# Patient Record
Sex: Male | Born: 1975 | Race: White | Hispanic: No | Marital: Married | Smoking: Never smoker
Health system: Southern US, Community
[De-identification: ages and names within clinical notes are randomized; demographics above are authoritative.]

## PROBLEM LIST (undated history)

## (undated) DIAGNOSIS — Z9989 Dependence on other enabling machines and devices: Secondary | ICD-10-CM

## (undated) DIAGNOSIS — R112 Nausea with vomiting, unspecified: Secondary | ICD-10-CM

## (undated) DIAGNOSIS — I10 Essential (primary) hypertension: Secondary | ICD-10-CM

## (undated) DIAGNOSIS — G4733 Obstructive sleep apnea (adult) (pediatric): Secondary | ICD-10-CM

## (undated) DIAGNOSIS — M109 Gout, unspecified: Secondary | ICD-10-CM

## (undated) DIAGNOSIS — R7303 Prediabetes: Secondary | ICD-10-CM

## (undated) DIAGNOSIS — Z9889 Other specified postprocedural states: Secondary | ICD-10-CM

## (undated) HISTORY — PX: SHOULDER ACROMIOPLASTY: SHX6093

## (undated) HISTORY — DX: Prediabetes: R73.03

## (undated) HISTORY — DX: Gout, unspecified: M10.9

## (undated) HISTORY — DX: Essential (primary) hypertension: I10

## (undated) HISTORY — DX: Obstructive sleep apnea (adult) (pediatric): G47.33

## (undated) HISTORY — DX: Dependence on other enabling machines and devices: Z99.89

---

## 2013-09-05 ENCOUNTER — Ambulatory Visit (INDEPENDENT_AMBULATORY_CARE_PROVIDER_SITE_OTHER): Payer: BC Managed Care – PPO | Admitting: General Surgery

## 2013-09-05 ENCOUNTER — Encounter (INDEPENDENT_AMBULATORY_CARE_PROVIDER_SITE_OTHER): Payer: Self-pay | Admitting: General Surgery

## 2013-09-05 VITALS — BP 126/80 | HR 76 | Ht 74.0 in | Wt >= 6400 oz

## 2013-09-05 DIAGNOSIS — Z6841 Body Mass Index (BMI) 40.0 and over, adult: Secondary | ICD-10-CM

## 2013-09-05 NOTE — Patient Instructions (Signed)
Congratulations on starting your journey to a healthier life! Over the next few weeks you will be undergoing tests (x-rays and labs) and seeing specialists to help evaluate you for weight loss surgery.  These tests and consultations with a psychologist and nutritionist are needed to prepare you for the lifestyle changes that lie ahead and are often required by insurance companies to approve you for surgery.   Pathway to Surgery:  Over the next few weeks -->Lab work -->Radiology tests   - Chest x-ray - make sure your lungs are normal before surgery  - Upper GI - you drink barium and pictures are taken as it travels down your  esophagus and into your stomach - looks for reflux and a hiatal hernia which may  need to repaired at the same time as your weight loss surgery  - Abdominal Ultrasound - looks at your gallbladder and liver  - Mammogram - up to date mammogram if you are a male -->EKG  -->Sleep study - if you are felt to be at high risk for obstructive sleep apnea -->H. Pylori breath test ([employer]ath[employer]ek) - you surgeon may order this test to see if you have  a bacteria (H pylori) in your stomach which makes you at higher risk to develop a  ulcer or inflammation of your stomach -->Nutrition consultation -->Psychologist consultation -->Other specialist consults - your surgeon may determine that you need to see a  specialist like a cardiologist or pulmnologist depending on your health history -->Watch EMMI video about your planned weight loss surgery -->you can look at [email or web address] to learn more about weight loss surgery and  compare surgery outcomes -->you can look at our new website - [email or web address] - available mid-August 2015  Two weeks prior to surgery  Go on the extremely low carb liquid diet - this will decrease the size of your liver  which will make surgery safer - the nutritionist will go over this at a later date  Attend preoperative appointment with your surgeon  Attend  preoperative surgery class  One week prior to surgery  No aspirin products.  Tylenol is acceptable   24 hours prior to surgery  No alcoholic beverages  Report fever greater than 100.5 or excessive nasal drainage suggesting infection  Continue bariatric preop diet  Perform bowel prep if ordered  Do not eat or drink anything after midnight the night before surgery  Do not take any medications except those instructed by the anesthesiologist  Morning of surgery  Please arrive at the hospital at least 2 hours before your scheduled surgery time.  No makeup, fingernail polish or jewelry  Bring insurance cards with you  Bring your CPAP mask if you use this

## 2013-09-05 NOTE — Progress Notes (Addendum)
Patient ID: Juan Winters, male   DOB: Dec 26, 1975, 37 y.o.   MRN: [id number]  Chief Complaint  Patient presents with  . new bariatric    HPI Juan Winters. Juan Winters is a 38 y.o. male.   HPI 38 year old morbidly obese Caucasian male referred by Dr. Paulino Rily for evaluation of weight loss surgery. The patient states he has struggled with his weight for many years. Despite numerous attempts for sustained weight loss he has been unsuccessful. He has done lots of yo-yo dieting. He has done Atkins, [place], [place] quick weight loss clinic - all without any long-term success. He is currently doing a physician supervised medical weight loss program.  He participated in our online seminar.  His comorbidities include hypertension, obstructive sleep apnea on CPAP, gout, right ankle pain, and prediabetes  He is most interested in the sleeve gastrectomy. His wife has had a Lap band and has had some issues. He also is unsure about the gastric bypass because of the potential nutritional problems associated with it but it is open to discussing it further Past Medical History  Diagnosis Date  . Hypertension   . OSA on CPAP   . Gout of big toe   . Prediabetes     Past Surgical History  Procedure Laterality Date  . Shoulder acromioplasty      Family History  Problem Relation Age of Onset  . Pulmonary embolism Father   . Pulmonary embolism Sister     reports hypercoagulable workup negative    Social History History  Substance Use Topics  . Smoking status: Never Smoker   . Smokeless tobacco: Not on file  . Alcohol Use: Yes    No Known Allergies  Current Outpatient Prescriptions  Medication Sig Dispense Refill  . hydrochlorothiazide (HYDRODIURIL) 12.5 MG tablet        No current facility-administered medications for this visit.    Review of Systems Review of Systems  Constitutional: Negative for fever, chills, appetite change and unexpected weight change.  HENT: Negative for  congestion and trouble swallowing.   Eyes: Negative for visual disturbance.  Respiratory: Negative for chest tightness and shortness of breath.        +osa on CPAP; has a lot more energy since started CPAP  Cardiovascular: Negative for chest pain and leg swelling.       No PND, no orthopnea, + some DOE  Gastrointestinal: Negative for nausea, vomiting, abdominal pain, diarrhea, constipation and blood in stool.       Some occasional reflux about once per month  Genitourinary: Negative for dysuria and hematuria.  Musculoskeletal: Negative.        Right ankle pain; gout in big toe recently.   Skin: Negative for rash.  Neurological: Negative for seizures and speech difficulty.  Hematological: Does not bruise/bleed easily.       Remote h/o of blood clot in superficial vein - treated with elevation, ASA; sister with PE/DVT; father had PE. States his and his sister's hypercoagulable workup was negative  Psychiatric/Behavioral: Negative for behavioral problems and confusion.    Blood pressure 126/80, pulse 76, height 6\' 2"  (1.88 m), weight 410 lb 3.2 oz (186.065 kg).  Physical Exam Physical Exam  Constitutional: He is oriented to person, place, and time. He appears well-developed and well-nourished. No distress.  Morbidly obese  HENT:  Head: Normocephalic and atraumatic.  Right Ear: External ear normal.  Left Ear: External ear normal.  Eyes: Conjunctivae are normal. No scleral icterus.  Neck: Normal range of  motion. Neck supple. No tracheal deviation present. No thyromegaly present.  Cardiovascular: Normal rate, normal heart sounds and intact distal pulses.   Pulmonary/Chest: Effort normal and breath sounds normal. No respiratory distress. He has no wheezes.  Abdominal: Soft. He exhibits no distension. There is no hepatomegaly. There is no tenderness. There is no rebound and no guarding.  Musculoskeletal: Normal range of motion. He exhibits no edema and no tenderness.  Lymphadenopathy:     He has no cervical adenopathy.  Neurological: He is alert and oriented to person, place, and time. He exhibits normal muscle tone.  Skin: Skin is warm and dry. No rash noted. He is not diaphoretic. No erythema. No pallor.  Psychiatric: He has a normal mood and affect. His behavior is normal. Judgment and thought content normal.    Data Reviewed PCP letter of medical necessity Self reported dietary history  Assessment    Morbid obesity BMI 52.67 Hypertension OSA on CPAP Prediabetes Gout     Plan    The patient meets weight loss surgery criteria. I think the patient would be an acceptable candidate for Laparoscopic vertical sleeve gastrectomy or roux en y gastric bypass  We discussed laparoscopic sleeve gastrectomy. We discussed the preoperative, operative and postoperative process. Using diagrams, I explained the surgery in detail including the performance of an EGD near the end of the surgery and an Upper GI swallow study on POD 1. We discussed the typical hospital course including a 2-3 day stay baring any complications.   The patient was given educational material. I quoted the patient that most patients can lose up to 50-70% of their excess weight. We did discuss the possibility of weight regain several years after the procedure.  The risks of infection, bleeding, pain, scarring, weight regain, too little or too much weight loss, vitamin deficiencies and need for lifelong vitamin supplementation, hair loss, need for protein supplementation, leaks, stricture, reflux, food intolerance, gallstone formation, hernia, need for reoperation and conversion to roux Y gastric bypass, need for open surgery, injury to spleen or surrounding structures, DVT's, PE, and death again discussed with the patient and the patient expressed understanding and desires to proceed with laparoscopic vertical sleeve gastrectomy, possible open, intraoperative endoscopy.  We then briefly discussed laparoscopic  Roux-en-Y gastric bypass. Using diagrams, I explained the surgery in detail including the performance of an EGD near the end of the surgery and an Upper GI swallow study on POD 1. We discussed the typical hospital course including a 2-3 day stay baring any complications.   The patient was given educational material. I quoted the patient that they can expect to lose 50-70% of their excess weight with the gastric bypass. We did discuss the possibility of weight regain several years after the procedure.  We discussed the risk and benefits of surgery including but not limited to anesthesia risk, bleeding, infection, anastomotic edema requiring a few additional days in the hospital, postop nausea, possible conversion to open procedure, blood clot formation, anastomotic leak, anastomotic stricture, ulcer formation, death, respiratory complications, intestinal blockage, internal hernia, gallstone formation, vitamin and nutritional deficiencies, hair loss, weight regain injury to surrounding structures, failure to lose weight and mood changes.  We discussed that before and after surgery that there would be an alteration in their diet. I explained that we have put them on a diet 2 weeks before surgery. I also explained that they would be on a liquid diet for 2 weeks after surgery. We discussed that they would have to avoid  certain foods after surgery. We discussed the importance of physical activity as well as compliance with our dietary and supplement recommendations and routine follow-up.  I explained to the patient that we will start our evaluation process which includes labs, Upper GI to evaluate stomach and swallowing anatomy, nutritionist consultation, psychiatrist consultation, EKG, CXR, abdominal ultrasound.  He was given EMMI video access for Sleeve gastrectomy and lap gastric bypass  I also encouraged him to participate in the monthly support group meetings.   Addendum 09/05/2013 3:30 PM-apparently  you've labs that we were able to obtain from his primary care physician's office which were done in March 2015. He had a normal CMET. His hypercoagulable workup was negative. Total cholesterol 214, LDL 149, triglycerides 116;  Meryn Sarracino M. Andrey CampanileWilson, MD, FACS General, Bariatric, & Minimally Invasive Surgery [facility], [place]          [facility] LLCWILSON,Celvin Taney M 09/05/2013, 1:34 PM

## 2013-10-03 ENCOUNTER — Other Ambulatory Visit (HOSPITAL_COMMUNITY): Payer: Self-pay

## 2013-10-04 ENCOUNTER — Ambulatory Visit (HOSPITAL_COMMUNITY)
Admission: RE | Admit: 2013-10-04 | Discharge: 2013-10-04 | Disposition: A | Discharge status: Home / Self Care | Payer: BC Managed Care – PPO | Source: Ambulatory Visit | Attending: General Surgery | Admitting: General Surgery

## 2013-10-04 ENCOUNTER — Other Ambulatory Visit (INDEPENDENT_AMBULATORY_CARE_PROVIDER_SITE_OTHER): Payer: Self-pay | Admitting: General Surgery

## 2013-10-04 DIAGNOSIS — Z6841 Body Mass Index (BMI) 40.0 and over, adult: Principal | ICD-10-CM

## 2013-10-04 DIAGNOSIS — K449 Diaphragmatic hernia without obstruction or gangrene: Secondary | ICD-10-CM | POA: Diagnosis not present

## 2013-10-04 DIAGNOSIS — I10 Essential (primary) hypertension: Secondary | ICD-10-CM | POA: Insufficient documentation

## 2013-10-04 DIAGNOSIS — G4733 Obstructive sleep apnea (adult) (pediatric): Secondary | ICD-10-CM | POA: Insufficient documentation

## 2013-10-04 DIAGNOSIS — R7309 Other abnormal glucose: Secondary | ICD-10-CM | POA: Insufficient documentation

## 2013-10-04 DIAGNOSIS — M109 Gout, unspecified: Secondary | ICD-10-CM | POA: Diagnosis not present

## 2013-10-15 ENCOUNTER — Encounter: Payer: BC Managed Care – PPO | Admitting: Dietician

## 2014-01-23 ENCOUNTER — Encounter: Payer: 59 | Attending: General Surgery | Admitting: Dietician

## 2014-01-23 ENCOUNTER — Encounter: Payer: Self-pay | Admitting: Dietician

## 2014-01-23 VITALS — Ht 74.0 in | Wt >= 6400 oz

## 2014-01-23 DIAGNOSIS — Z6841 Body Mass Index (BMI) 40.0 and over, adult: Secondary | ICD-10-CM | POA: Diagnosis not present

## 2014-01-23 DIAGNOSIS — Z713 Dietary counseling and surveillance: Secondary | ICD-10-CM | POA: Insufficient documentation

## 2014-01-23 NOTE — Progress Notes (Signed)
  Pre-Op Assessment Visit:  Pre-Operative Sleeve Gastrectomy Surgery  Medical Nutrition Therapy:  Appt start time: 1430   End time:  1500.  Patient was seen on 01/23/14 for Pre-Operative Nutrition Assessment. Assessment and [facility] coordinator on 01/23/14.   Preferred Learning Style:   No preference indicated   Learning Readiness:   Ready  Handouts given during visit include:  Pre-Op Goals Bariatric Surgery Protein Shakes   During the appointment today the following Pre-Op Goals were reviewed with the patient: Maintain or lose weight as instructed by your surgeon Make healthy food choices Begin to limit portion sizes Limited concentrated sugars and fried foods Keep fat/sugar in the single digits per serving on   food labels Practice CHEWING your food  (aim for 30 chews per bite or until applesauce consistency) Practice not drinking 15 minutes before, during, and 30 minutes after each meal/snack Avoid all carbonated beverages  Avoid/limit caffeinated beverages  Avoid all sugar-sweetened beverages Consume 3 meals per day; eat every 3-5 hours Make a list of non-food related activities Aim for 64-100 ounces of FLUID daily  Aim for at least 60-80 grams of PROTEIN daily Look for a liquid protein source that contain ?15 g protein and ?5 g carbohydrate  (ex: shakes, drinks, shots)  Patient-Centered Goals: Fayrene FearingJames would like to reduce his gout attacks, be able to walk upstairs without being winded, improve overall health and joint pain.   Fayrene FearingJames feels very confident (10) he can follow the Pre-Op goals.  Fayrene FearingJames feels like the Pre-Op goals are very important.   Demonstrated degree of understanding via:  Teach Back   Teaching Method Utilized:  Visual Auditory Hands on  Barriers to learning/adherence to lifestyle change: none  Patient to call the [facility] to enroll in Pre-Op and  Post-Op Nutrition Education when surgery date is scheduled.

## 2014-01-23 NOTE — Patient Instructions (Signed)

## 2014-02-13 ENCOUNTER — Ambulatory Visit: Payer: BC Managed Care – PPO | Admitting: Dietician

## 2014-02-19 ENCOUNTER — Ambulatory Visit: Payer: BC Managed Care – PPO | Admitting: Dietician

## 2014-03-03 ENCOUNTER — Encounter: Payer: 59 | Attending: General Surgery

## 2014-03-03 VITALS — Ht 74.0 in | Wt >= 6400 oz

## 2014-03-03 DIAGNOSIS — Z6841 Body Mass Index (BMI) 40.0 and over, adult: Secondary | ICD-10-CM | POA: Diagnosis not present

## 2014-03-03 DIAGNOSIS — Z713 Dietary counseling and surveillance: Secondary | ICD-10-CM | POA: Insufficient documentation

## 2014-03-04 NOTE — Progress Notes (Signed)
  Pre-Operative Nutrition Class:  Appt start time: 6389   End time:  1830.  Patient was seen on 03/03/14 for Pre-Operative Bariatric Surgery Education at the [facility].   Surgery date:  Surgery type: Gastric sleeve [facility] weight at [facility]: 422 lbs on 01/23/14  Weight today: 419.5 lbs  TANITA  BODY COMP RESULTS  03/03/14   BMI (kg/m^2) 53.9   Fat Mass (lbs) 259.5   Fat Free Mass (lbs) 160   Total Body Water (lbs) 117   Samples given per MNT protocol. Patient educated on appropriate usage: Premier protein shake (vanilla - qty 1) Lot #: [id number] Exp: 12/16  Unjury protein powder (chocolate - qty 1) Lot #: [id number] Exp: 01/2015  PB2 (qty 1) Lot #: [id number] Exp: 8/16  Bariatric Advantage Calcium Citrate chew (Tropical Orange - qty 1) Lot #: [id number] Exp:5/16  The following the learning objectives were met by the patient during this course:  Identify Pre-Op Dietary Goals and will begin 2 weeks pre-operatively  Identify appropriate sources of fluids and proteins   State protein recommendations and appropriate sources pre and post-operatively  Identify Post-Operative Dietary Goals and will follow for 2 weeks post-operatively  Identify appropriate multivitamin and calcium sources  Describe the need for physical activity post-operatively and will follow MD recommendations  State when to call healthcare provider regarding medication questions or post-operative complications  Handouts given during class include:  Pre-Op Bariatric Surgery Diet Handout  Protein Shake Handout  Post-Op Bariatric Surgery Nutrition Handout  BEL[facility] Program Information Flyer  Support Group Information Flyer  [facility] Bariatric Supplements Price List  Follow-Up Plan: Patient will follow-up at [facility] 2 weeks post operatively for diet advancement per MD.

## 2014-03-19 ENCOUNTER — Ambulatory Visit (INDEPENDENT_AMBULATORY_CARE_PROVIDER_SITE_OTHER): Payer: Self-pay | Admitting: General Surgery

## 2014-04-03 ENCOUNTER — Encounter (HOSPITAL_COMMUNITY): Payer: Self-pay

## 2014-04-03 ENCOUNTER — Encounter (HOSPITAL_COMMUNITY)
Admission: RE | Admit: 2014-04-03 | Discharge: 2014-04-03 | Disposition: A | Discharge status: Home / Self Care | Payer: 59 | Source: Ambulatory Visit | Attending: General Surgery | Admitting: General Surgery

## 2014-04-03 DIAGNOSIS — Z6841 Body Mass Index (BMI) 40.0 and over, adult: Secondary | ICD-10-CM | POA: Insufficient documentation

## 2014-04-03 DIAGNOSIS — Z01812 Encounter for preprocedural laboratory examination: Secondary | ICD-10-CM | POA: Diagnosis not present

## 2014-04-03 HISTORY — DX: Other specified postprocedural states: Z98.890

## 2014-04-03 HISTORY — DX: Nausea with vomiting, unspecified: R11.2

## 2014-04-03 LAB — CBC WITH DIFFERENTIAL/PLATELET
Basophils Absolute: 0.1 10*3/uL (ref 0.0–0.1)
Basophils Relative: 1 % (ref 0–1)
Eosinophils Absolute: 0.2 10*3/uL (ref 0.0–0.7)
Eosinophils Relative: 2 % (ref 0–5)
HCT: 43.7 % (ref 39.0–52.0)
HEMOGLOBIN: 14.3 g/dL (ref 13.0–17.0)
LYMPHS PCT: 29 % (ref 12–46)
Lymphs Abs: 2.4 10*3/uL (ref 0.7–4.0)
MCH: 30.2 pg (ref 26.0–34.0)
MCHC: 32.7 g/dL (ref 30.0–36.0)
MCV: 92.4 fL (ref 78.0–100.0)
Monocytes Absolute: 0.8 10*3/uL (ref 0.1–1.0)
Monocytes Relative: 10 % (ref 3–12)
NEUTROS PCT: 58 % (ref 43–77)
Neutro Abs: 4.7 10*3/uL (ref 1.7–7.7)
Platelets: 209 10*3/uL (ref 150–400)
RBC: 4.73 MIL/uL (ref 4.22–5.81)
RDW: 14.1 % (ref 11.5–15.5)
WBC: 8.2 10*3/uL (ref 4.0–10.5)

## 2014-04-03 LAB — COMPREHENSIVE METABOLIC PANEL
ALT: 70 U/L — ABNORMAL HIGH (ref 0–53)
ANION GAP: 6 (ref 5–15)
AST: 48 U/L — ABNORMAL HIGH (ref 0–37)
Albumin: 4.1 g/dL (ref 3.5–5.2)
Alkaline Phosphatase: 44 U/L (ref 39–117)
BUN: 13 mg/dL (ref 6–23)
CHLORIDE: 105 mmol/L (ref 96–112)
CO2: 30 mmol/L (ref 19–32)
CREATININE: 0.96 mg/dL (ref 0.50–1.35)
Calcium: 9.7 mg/dL (ref 8.4–10.5)
GFR calc Af Amer: >90 mL/min (ref >90)
GFR calc non Af Amer: >90 mL/min (ref >90)
Glucose, Bld: 96 mg/dL (ref 70–99)
POTASSIUM: 4.5 mmol/L (ref 3.5–5.1)
SODIUM: 141 mmol/L (ref 135–145)
Total Bilirubin: 0.6 mg/dL (ref 0.3–1.2)
Total Protein: 7.5 g/dL (ref 6.0–8.3)

## 2014-04-03 NOTE — Progress Notes (Signed)
Spoke with Amy in Portable Equipment and requested a Bari-Bed for am of surgery.

## 2014-04-03 NOTE — Patient Instructions (Signed)
Juan Winters  04/03/2014   Your procedure is scheduled on: Monday 04/14/2014 [facility] and follow signs to               Short Stay Center at 0530 AM.  Call this number if you have problems the morning of surgery [phone]   Remember:  Do not eat food or drink liquids :After Midnight.     Take these medicines the morning of surgery with A SIP OF WATER: NONE                               You may not have any metal on your body including hair pins and              piercings  Do not wear jewelry, make-up, lotions, powders or perfumes.             Do not wear nail polish.  Do not shave  48 hours prior to surgery.              Men may shave face and neck.   Do not bring valuables to the hospital. [place] IS NOT             RESPONSIBLE   FOR VALUABLES.  Contacts, dentures or bridgework may not be worn into surgery.  Leave suitcase in the car. After surgery it may be brought to your room.     Patients discharged the day of surgery will not be allowed to drive home.  Name and phone number of your driver:  Special Instructions: N/A              Please read over the following fact sheets you were given: _____________________________________________________________________             [facility] Pre[facility] Before surgery, you can play an important role.  Because skin is not sterile, your skin needs to be as free of germs as possible.  You can reduce the number of germs on your skin by washing with CHG (chlorahexidine gluconate) soap before surgery.  CHG is an antiseptic cleaner which kills germs and bonds with the skin to continue killing germs even after washing. Please DO NOT use if you have an allergy to CHG or antibacterial soaps.  If your skin becomes reddened/irritated stop using the CHG and inform your nurse when you arrive at Short Stay. Do not shave (including legs and underarms) for at least 48 hours  prior to the first CHG shower.  You may shave your face/neck. Please follow these instructions carefully:  1.  Shower with CHG Soap the night before surgery and the  morning of Surgery.  2.  If you choose to wash your hair, wash your hair first as usual with your  normal  shampoo.  3.  After you shampoo, rinse your hair and body thoroughly to remove the  shampoo.                           4.  Use CHG as you would any other liquid soap.  You can apply chg directly  to the skin and wash                       Gently with  a scrungie or clean washcloth.  5.  Apply the CHG Soap to your body ONLY FROM THE NECK DOWN.   Do not use on face/ open                           Wound or open sores. Avoid contact with eyes, ears mouth and genitals (private parts).                       Wash face,  Genitals (private parts) with your normal soap.             6.  Wash thoroughly, paying special attention to the area where your surgery  will be performed.  7.  Thoroughly rinse your body with warm water from the neck down.  8.  DO NOT shower/wash with your normal soap after using and rinsing off  the CHG Soap.                9.  Pat yourself dry with a clean towel.            10.  Wear clean pajamas.            11.  Place clean sheets on your bed the night of your first shower and do not  sleep with pets. Day of Surgery : Do not apply any lotions/deodorants the morning of surgery.  Please wear clean clothes to the hospital/surgery center.  FAILURE TO FOLLOW THESE INSTRUCTIONS MAY RESULT IN THE CANCELLATION OF YOUR SURGERY PATIENT SIGNATURE_________________________________  NURSE SIGNATURE__________________________________  ________________________________________________________________________   Juan Winters  An incentive spirometer is a tool that can help keep your lungs clear and active. This tool measures how well you are filling your lungs with each breath. Taking long deep breaths may help reverse  or decrease the chance of developing breathing (pulmonary) problems (especially infection) following:  A long period of time when you are unable to move or be active. BEFORE THE PROCEDURE   If the spirometer includes an indicator to show your best effort, your nurse or respiratory therapist will set it to a desired goal.  If possible, sit up straight or lean slightly forward. Try not to slouch.  Hold the incentive spirometer in an upright position. INSTRUCTIONS FOR USE  1. Sit on the edge of your bed if possible, or sit up as far as you can in bed or on a chair. 2. Hold the incentive spirometer in an upright position. 3. Breathe out normally. 4. Place the mouthpiece in your mouth and seal your lips tightly around it. 5. Breathe in slowly and as deeply as possible, raising the piston or the ball toward the top of the column. 6. Hold your breath for 3-5 seconds or for as long as possible. Allow the piston or ball to fall to the bottom of the column. 7. Remove the mouthpiece from your mouth and breathe out normally. 8. Rest for a few seconds and repeat Steps 1 through 7 at least 10 times every 1-2 hours when you are awake. Take your time and take a few normal breaths between deep breaths. 9. The spirometer may include an indicator to show your best effort. Use the indicator as a goal to work toward during each repetition. 10. After each set of 10 deep breaths, practice coughing to be sure your lungs are clear. If you have an incision (the cut made at the time of surgery), support your  incision when coughing by placing a pillow or rolled up towels firmly against it. Once you are able to get out of bed, walk around indoors and cough well. You may stop using the incentive spirometer when instructed by your caregiver.  RISKS AND COMPLICATIONS  Take your time so you do not get dizzy or light-headed.  If you are in pain, you may need to take or ask for pain medication before doing incentive  spirometry. It is harder to take a deep breath if you are having pain. AFTER USE  Rest and breathe slowly and easily.  It can be helpful to keep track of a log of your progress. Your caregiver can provide you with a simple table to help with this. If you are using the spirometer at home, follow these instructions: [place] IF:   You are having difficultly using the spirometer.  You have trouble using the spirometer as often as instructed.  Your pain medication is not giving enough relief while using the spirometer.  You develop fever of 100.5 F (38.1 C) or higher. SEEK IMMEDIATE MEDICAL CARE IF:   You cough up bloody sputum that had not been present before.  You develop fever of 102 F (38.9 C) or greater.  You develop worsening pain at or near the incision site. MAKE SURE YOU:   Understand these instructions.  Will watch your condition.  Will get help right away if you are not doing well or get worse. Document Released: 06/20/2006 Document Revised: 05/02/2011 Document Reviewed: 08/21/2006 [facility] Patient Information 2014 [place], [place].   ________________________________________________________________________

## 2014-04-13 NOTE — H&P (Signed)
Juan Winters. [place] 04/03/2014 2:45 PM Location: [facility] Patient #: [id number] DOB: 1975/09/09 Married / Language: English / Race: White Male History of Present Illness Juan Hiss M. Shalisa Mcquade Juan Winters; 04/13/2014 10:26 PM) Patient words: preop bari.  The patient is a 39 year old male who presents for a pre-op visit. He is currently scheduled for laparoscopic sleeve gastrectomy with possible hiatal hernia repair for 2/22. I initially met him on 09/05/13. His weight at that time was 410 pounds. He denies any significant changes to his medical history since being seen last summer. He still uses CPAP. He does have a family history of DVT but reports his hypercoagable workup was negative.  His bariatric surgery evaluation labs were unremarkable except for a total cholesterol level of 214, LDL 149, TG 110. His UGI showed a small hiatal hernia. His abdominal ultrasound revealed evidence of a fatty liver. He has some occasional GERD.  He denies any cp, sob, doe, pnd, orthopnea. Other Problems Juan Curry, Juan Winters; 04/13/2014 10:29 PM) Migraine Headache High blood pressure GOUT, ARTHRITIS (274.00  M10.9) OBESITY, MORBID, BMI 50 OR HIGHER (278.01  E66.01) FATTY LIVER (571.8  K76.0) OSA ON CPAP (327.23  G47.33) FAMILY HISTORY OF DVT (V17.49  Z82.49) DYSLIPIDEMIA (272.4  E78.5)  Past Surgical History Juan Buffy, Juan Winters; 04/03/2014 3:19 PM) Foot Surgery Right. Shoulder Surgery Left.  Diagnostic Studies History Juan Buffy, Juan Winters; 04/03/2014 3:19 PM) Colonoscopy never  Allergies Juan Buffy, Juan Winters; 04/03/2014 3:20 PM) No Known Drug Allergies 04/03/2014  Medication History Juan Curry, Juan Winters; 04/13/2014 10:29 PM) Allopurinol (100MG Tablet, Oral daily) Active. Colchicine (0.6MG Capsule, Oral daily) Active. Hydrochlorothiazide (12.5MG Tablet, Oral daily) Active. B12-Active (1MG Tablet Chewable, Oral daily) Active. OxyCODONE HCl (5MG/5ML Solution, 5-10 Milliliter Oral every  four hours, as needed, Taken starting 04/03/2014) Active.  Social History Juan Buffy, Juan Winters; 04/03/2014 3:19 PM) Alcohol use Occasional alcohol use. Caffeine use Carbonated beverages, Tea. No drug use Tobacco use Never smoker.  Family History Juan Buffy, Juan Winters; 04/03/2014 3:19 PM) Hypertension Father.     Review of Systems Juan Winters; 04/03/2014 3:19 PM) General Not Present- Appetite Loss, Chills, Fatigue, Fever, Night Sweats, Weight Gain and Weight Loss. Skin Not Present- Change in Wart/Mole, Dryness, Hives, Jaundice, New Lesions, Non-Healing Wounds, Rash and Ulcer. HEENT Not Present- Earache, Hearing Loss, Hoarseness, Nose Bleed, Oral Ulcers, Ringing in the Ears, Seasonal Allergies, Sinus Pain, Sore Throat, Visual Disturbances, Wears glasses/contact lenses and Yellow Eyes. Respiratory Not Present- Bloody sputum, Chronic Cough, Difficulty Breathing, Snoring and Wheezing. Breast Not Present- Breast Mass, Breast Pain, Nipple Discharge and Skin Changes. Cardiovascular Not Present- Chest Pain, Difficulty Breathing Lying Down, Leg Cramps, Palpitations, Rapid Heart Rate, Shortness of Breath and Swelling of Extremities. Gastrointestinal Not Present- Abdominal Pain, Bloating, Bloody Stool, Change in Bowel Habits, Chronic diarrhea, Constipation, Difficulty Swallowing, Excessive gas, Gets full quickly at meals, Hemorrhoids, Indigestion, Nausea, Rectal Pain and Vomiting. Male Genitourinary Not Present- Blood in Urine, Change in Urinary Stream, Frequency, Impotence, Nocturia, Painful Urination, Urgency and Urine Leakage. Musculoskeletal Not Present- Back Pain, Joint Pain, Joint Stiffness, Muscle Pain, Muscle Weakness and Swelling of Extremities. Neurological Not Present- Decreased Memory, Fainting, Headaches, Numbness, Seizures, Tingling, Tremor, Trouble walking and Weakness. Psychiatric Not Present- Anxiety, Bipolar, Change in Sleep Pattern, Depression, Fearful and Frequent  crying. Endocrine Not Present- Cold Intolerance, Excessive Hunger, Hair Changes, Heat Intolerance, Hot flashes and New Diabetes. Hematology Not Present- Easy Bruising, Excessive bleeding, Gland problems, HIV and Persistent Infections.  Vitals Juan Winters; 09/23/2120 4:82 PM) 04/03/2014  3:20 PM Weight: 427.4 lb Height: 75in Body Surface Area: 3.2 m Body Mass Index: 53.42 kg/m Temp.: 97.58F(Oral)  Pulse: 92 (Regular)  Resp.: 16 (Unlabored)  BP: 128/94 (Sitting, Left Arm, Standard)     Physical Exam Juan Winters; 04/13/2014 10:22 PM)  General Mental Status-Alert. General Appearance-Consistent with stated age. Hydration-Well hydrated. Voice-Normal. Note: MORBIDLY OBESE   Head and Neck Head-normocephalic, atraumatic with no lesions or palpable masses. Trachea-midline. Thyroid Gland Characteristics - normal size and consistency.  Eye Eyeball - Bilateral-Extraocular movements intact. Sclera/Conjunctiva - Bilateral-No scleral icterus.  Chest and Lung Exam Chest and lung exam reveals -quiet, even and easy respiratory effort with no use of accessory muscles and on auscultation, normal breath sounds, no adventitious sounds and normal vocal resonance. Inspection Chest Wall - Normal. Back - normal.  Breast - Did not examine.  Cardiovascular Cardiovascular examination reveals -normal heart sounds, regular rate and rhythm with no murmurs and normal pedal pulses bilaterally.  Abdomen Inspection Inspection of the abdomen reveals - No Hernias. Skin - Scar - no surgical scars. Palpation/Percussion Palpation and Percussion of the abdomen reveal - Soft, Non Tender, No Rebound tenderness, No Rigidity (guarding) and No hepatosplenomegaly. Auscultation Auscultation of the abdomen reveals - Bowel sounds normal.  Peripheral Vascular Upper Extremity Palpation - Pulses bilaterally normal.  Neurologic Neurologic evaluation reveals -alert  and oriented x 3 with no impairment of recent or remote memory. Mental Status-Normal.  Neuropsychiatric The patient's mood and affect are described as -normal. Judgment and Insight-insight is appropriate concerning matters relevant to self.  Musculoskeletal Normal Exam - Left-Upper Extremity Strength Normal and Lower Extremity Strength Normal. Normal Exam - Right-Upper Extremity Strength Normal and Lower Extremity Strength Normal.  Lymphatic Head & Neck  General Head & Neck Lymphatics: Bilateral - Description - Normal. Axillary - Did not examine. Femoral & Inguinal - Did not examine.    Assessment & Plan Juan Winters; 04/13/2014 10:29 PM)  OBESITY, MORBID, BMI 50 OR HIGHER (278.01  E66.01) Impression: We reviewed his workup to date. We discussed the UGI and that I would test for a clinically significant one during surgery. If found to have a clinically significant hiatal hernia, I recommended dissecting out the crura and reducing the stomach and reapproximating the crura. We rediscussed the typical postop course. He was given his postop pain medication Rx today. All of his questions were asked and answered.  Current Plans Instructions: Congratulations on starting your journey to a healthier life! Over the next few weeks you will be undergoing tests (x-rays and labs) and seeing specialists to help evaluate you for weight loss surgery. These tests and consultations with a psychologist and nutritionist are needed to prepare you for the lifestyle changes that lie ahead and are often required by insurance companies to approve you for surgery. Please call me if you have any questions during the evaluation.  Pathway to Surgery:  Two weeks prior to surgery Go on the extremely low carb liquid diet - this will decrease the size of your liver which will make surgery safer - the nutritionist will go over this at a later date Attend preoperative appointment with your  surgeon Attend preoperative surgery class  One week prior to surgery No aspirin products. Tylenol is acceptable  24 hours prior to surgery No alcoholic beverages Report fever greater than 100.5 or excessive nasal drainage suggesting infection Continue bariatric preop diet Perform bowel prep if ordered Do not eat or drink anything after midnight the night before surgery  Do not take any medications except those instructed by the anesthesiologist  Morning of surgery Please arrive at the hospital at least 2 hours before your scheduled surgery time. No makeup, fingernail polish or jewelry Bring insurance cards with you Bring your CPAP mask if you use this Started OxyCODONE HCl 5MG/5ML, 5-10 Milliliter every four hours, as needed, 200 Milliliter, 04/03/2014, No Refill. ESSENTIAL HYPERTENSION (401.9  I10)  OSA ON CPAP (327.23  G47.33)  FATTY LIVER (571.8  K76.0)  DYSLIPIDEMIA (272.4  E78.5)  FAMILY HISTORY OF DVT (V17.49  Z82.49)  GOUT, ARTHRITIS (274.00  M10.9)  Juan Ruff. Redmond Pulling, Juan Winters, Juan Winters General, Bariatric, & Minimally Invasive Surgery [facility], [place]

## 2014-04-14 ENCOUNTER — Inpatient Hospital Stay (HOSPITAL_COMMUNITY): Payer: 59 | Admitting: Anesthesiology

## 2014-04-14 ENCOUNTER — Inpatient Hospital Stay (HOSPITAL_COMMUNITY)
Admission: RE | Admit: 2014-04-14 | Discharge: 2014-04-16 | DRG: 621 | Disposition: A | Discharge status: Home / Self Care | Payer: 59 | Source: Ambulatory Visit | Attending: General Surgery | Admitting: General Surgery

## 2014-04-14 ENCOUNTER — Encounter (HOSPITAL_COMMUNITY)
Admission: RE | Disposition: A | Discharge status: Home / Self Care | Payer: Self-pay | Source: Ambulatory Visit | Attending: General Surgery

## 2014-04-14 ENCOUNTER — Encounter (HOSPITAL_COMMUNITY): Payer: Self-pay | Admitting: *Deleted

## 2014-04-14 DIAGNOSIS — K219 Gastro-esophageal reflux disease without esophagitis: Secondary | ICD-10-CM | POA: Diagnosis present

## 2014-04-14 DIAGNOSIS — Z79891 Long term (current) use of opiate analgesic: Secondary | ICD-10-CM

## 2014-04-14 DIAGNOSIS — Z8249 Family history of ischemic heart disease and other diseases of the circulatory system: Secondary | ICD-10-CM

## 2014-04-14 DIAGNOSIS — E785 Hyperlipidemia, unspecified: Secondary | ICD-10-CM | POA: Diagnosis present

## 2014-04-14 DIAGNOSIS — M109 Gout, unspecified: Secondary | ICD-10-CM | POA: Diagnosis present

## 2014-04-14 DIAGNOSIS — K449 Diaphragmatic hernia without obstruction or gangrene: Secondary | ICD-10-CM | POA: Diagnosis present

## 2014-04-14 DIAGNOSIS — Z79899 Other long term (current) drug therapy: Secondary | ICD-10-CM

## 2014-04-14 DIAGNOSIS — Z9989 Dependence on other enabling machines and devices: Secondary | ICD-10-CM

## 2014-04-14 DIAGNOSIS — Z01812 Encounter for preprocedural laboratory examination: Secondary | ICD-10-CM | POA: Diagnosis not present

## 2014-04-14 DIAGNOSIS — Z6841 Body Mass Index (BMI) 40.0 and over, adult: Secondary | ICD-10-CM

## 2014-04-14 DIAGNOSIS — I10 Essential (primary) hypertension: Secondary | ICD-10-CM | POA: Diagnosis present

## 2014-04-14 DIAGNOSIS — M1000 Idiopathic gout, unspecified site: Secondary | ICD-10-CM | POA: Diagnosis present

## 2014-04-14 DIAGNOSIS — Z9884 Bariatric surgery status: Secondary | ICD-10-CM

## 2014-04-14 DIAGNOSIS — G4733 Obstructive sleep apnea (adult) (pediatric): Secondary | ICD-10-CM | POA: Diagnosis present

## 2014-04-14 DIAGNOSIS — K760 Fatty (change of) liver, not elsewhere classified: Secondary | ICD-10-CM | POA: Diagnosis present

## 2014-04-14 DIAGNOSIS — R110 Nausea: Secondary | ICD-10-CM

## 2014-04-14 HISTORY — PX: LAPAROSCOPIC GASTRIC SLEEVE RESECTION WITH HIATAL HERNIA REPAIR: SHX6512

## 2014-04-14 LAB — HEMOGLOBIN AND HEMATOCRIT, BLOOD
HCT: 41.8 % (ref 39.0–52.0)
Hemoglobin: 13.9 g/dL (ref 13.0–17.0)

## 2014-04-14 SURGERY — GASTRECTOMY, SLEEVE, LAPAROSCOPIC, WITH HIATAL HERNIA REPAIR
Anesthesia: General | Site: Abdomen

## 2014-04-14 MED ORDER — PROPOFOL 10 MG/ML IV BOLUS
INTRAVENOUS | Status: AC
  Filled 2014-04-14: qty 20

## 2014-04-14 MED ORDER — OXYCODONE HCL 5 MG/5ML PO SOLN
5.0000 mg | ORAL | Status: DC | PRN
  Administered 2014-04-15 – 2014-04-16 (×4): 10 mg via ORAL
  Filled 2014-04-14 (×4): qty 10

## 2014-04-14 MED ORDER — ENOXAPARIN SODIUM 30 MG/0.3ML ~~LOC~~ SOLN
30.0000 mg | Freq: Two times a day (BID) | SUBCUTANEOUS | Status: DC
  Administered 2014-04-15 – 2014-04-16 (×3): 30 mg via SUBCUTANEOUS
  Filled 2014-04-14 (×5): qty 0.3

## 2014-04-14 MED ORDER — BUPIVACAINE-EPINEPHRINE (PF) 0.25% -1:200000 IJ SOLN
INTRAMUSCULAR | Status: AC
  Filled 2014-04-14: qty 30

## 2014-04-14 MED ORDER — SODIUM CHLORIDE 0.9 % IJ SOLN
INTRAMUSCULAR | Status: AC
  Filled 2014-04-14: qty 50

## 2014-04-14 MED ORDER — NEOSTIGMINE METHYLSULFATE 10 MG/10ML IV SOLN
INTRAVENOUS | Status: DC | PRN
  Administered 2014-04-14: 5 mg via INTRAVENOUS

## 2014-04-14 MED ORDER — CEFOTETAN DISODIUM-DEXTROSE 2-2.08 GM-% IV SOLR
INTRAVENOUS | Status: AC
  Filled 2014-04-14: qty 50

## 2014-04-14 MED ORDER — SODIUM CHLORIDE 0.9 % IJ SOLN
INTRAMUSCULAR | Status: AC
  Filled 2014-04-14: qty 10

## 2014-04-14 MED ORDER — LACTATED RINGERS IV SOLN: INTRAVENOUS | Status: DC

## 2014-04-14 MED ORDER — LACTATED RINGERS IR SOLN
Status: DC | PRN
  Administered 2014-04-14: 1000 mL

## 2014-04-14 MED ORDER — ONDANSETRON HCL 4 MG/2ML IJ SOLN
4.0000 mg | INTRAMUSCULAR | Status: DC | PRN
  Administered 2014-04-14 – 2014-04-15 (×6): 4 mg via INTRAVENOUS
  Filled 2014-04-14 (×6): qty 2

## 2014-04-14 MED ORDER — BUPIVACAINE LIPOSOME 1.3 % IJ SUSP
20.0000 mL | Freq: Once | INTRAMUSCULAR | Status: DC
  Filled 2014-04-14: qty 20

## 2014-04-14 MED ORDER — PROPOFOL 10 MG/ML IV BOLUS
INTRAVENOUS | Status: DC | PRN
  Administered 2014-04-14: 200 mg via INTRAVENOUS

## 2014-04-14 MED ORDER — GLYCOPYRROLATE 0.2 MG/ML IJ SOLN
INTRAMUSCULAR | Status: DC | PRN
  Administered 2014-04-14: .8 mg via INTRAVENOUS

## 2014-04-14 MED ORDER — ACETAMINOPHEN 160 MG/5ML PO SOLN: 325.0000 mg | ORAL | Status: DC | PRN

## 2014-04-14 MED ORDER — STERILE WATER FOR IRRIGATION IR SOLN
Status: DC | PRN
  Administered 2014-04-14: 1000 mL

## 2014-04-14 MED ORDER — PANTOPRAZOLE SODIUM 40 MG IV SOLR
40.0000 mg | Freq: Every day | INTRAVENOUS | Status: DC
  Administered 2014-04-14 – 2014-04-15 (×2): 40 mg via INTRAVENOUS
  Filled 2014-04-14 (×3): qty 40

## 2014-04-14 MED ORDER — FENTANYL CITRATE 0.05 MG/ML IJ SOLN
INTRAMUSCULAR | Status: DC | PRN
  Administered 2014-04-14 (×3): 50 ug via INTRAVENOUS
  Administered 2014-04-14: 100 ug via INTRAVENOUS

## 2014-04-14 MED ORDER — HYDROMORPHONE HCL 2 MG/ML IJ SOLN
INTRAMUSCULAR | Status: AC
  Filled 2014-04-14: qty 1

## 2014-04-14 MED ORDER — SUCCINYLCHOLINE CHLORIDE 20 MG/ML IJ SOLN
INTRAMUSCULAR | Status: DC | PRN
  Administered 2014-04-14: 180 mg via INTRAVENOUS

## 2014-04-14 MED ORDER — ROCURONIUM BROMIDE 100 MG/10ML IV SOLN
INTRAVENOUS | Status: DC | PRN
  Administered 2014-04-14: 50 mg via INTRAVENOUS
  Administered 2014-04-14: 20 mg via INTRAVENOUS
  Administered 2014-04-14 (×3): 10 mg via INTRAVENOUS

## 2014-04-14 MED ORDER — ONDANSETRON HCL 4 MG/2ML IJ SOLN
INTRAMUSCULAR | Status: AC
  Filled 2014-04-14: qty 2

## 2014-04-14 MED ORDER — PROMETHAZINE HCL 25 MG/ML IJ SOLN: 12.5000 mg | Freq: Four times a day (QID) | INTRAMUSCULAR | Status: DC | PRN

## 2014-04-14 MED ORDER — ONDANSETRON HCL 4 MG/2ML IJ SOLN
INTRAMUSCULAR | Status: DC | PRN
  Administered 2014-04-14: 4 mg via INTRAVENOUS

## 2014-04-14 MED ORDER — MIDAZOLAM HCL 2 MG/2ML IJ SOLN
INTRAMUSCULAR | Status: AC
  Filled 2014-04-14: qty 2

## 2014-04-14 MED ORDER — CHLORHEXIDINE GLUCONATE 4 % EX LIQD: 60.0000 mL | Freq: Once | CUTANEOUS | Status: DC

## 2014-04-14 MED ORDER — LIDOCAINE HCL (CARDIAC) 20 MG/ML IV SOLN
INTRAVENOUS | Status: AC
  Filled 2014-04-14: qty 5

## 2014-04-14 MED ORDER — HEPARIN SODIUM (PORCINE) 5000 UNIT/ML IJ SOLN
5000.0000 [IU] | INTRAMUSCULAR | Status: AC
  Administered 2014-04-14: 5000 [IU] via SUBCUTANEOUS
  Filled 2014-04-14: qty 1

## 2014-04-14 MED ORDER — CEFOTETAN DISODIUM-DEXTROSE 2-2.08 GM-% IV SOLR
2.0000 g | INTRAVENOUS | Status: AC
  Administered 2014-04-14: 2 g via INTRAVENOUS

## 2014-04-14 MED ORDER — HYDROMORPHONE HCL 1 MG/ML IJ SOLN
INTRAMUSCULAR | Status: DC | PRN
  Administered 2014-04-14 (×2): 0.5 mg via INTRAVENOUS

## 2014-04-14 MED ORDER — UNJURY CHOCOLATE CLASSIC POWDER: 2.0000 [oz_av] | Freq: Four times a day (QID) | ORAL | Status: DC

## 2014-04-14 MED ORDER — SCOPOLAMINE 1 MG/3DAYS TD PT72
MEDICATED_PATCH | TRANSDERMAL | Status: AC
  Filled 2014-04-14: qty 1

## 2014-04-14 MED ORDER — FENTANYL CITRATE 0.05 MG/ML IJ SOLN
INTRAMUSCULAR | Status: AC
  Filled 2014-04-14: qty 5

## 2014-04-14 MED ORDER — EPHEDRINE SULFATE 50 MG/ML IJ SOLN
INTRAMUSCULAR | Status: DC | PRN
  Administered 2014-04-14: 5 mg via INTRAVENOUS
  Administered 2014-04-14: 10 mg via INTRAVENOUS

## 2014-04-14 MED ORDER — KCL IN DEXTROSE-NACL 20-5-0.45 MEQ/L-%-% IV SOLN
INTRAVENOUS | Status: DC
  Administered 2014-04-14: 12:00:00 via INTRAVENOUS
  Administered 2014-04-14 – 2014-04-15 (×2): 1000 mL via INTRAVENOUS
  Administered 2014-04-15 – 2014-04-16 (×3): via INTRAVENOUS
  Filled 2014-04-14 (×9): qty 1000

## 2014-04-14 MED ORDER — HYDROMORPHONE HCL 1 MG/ML IJ SOLN
INTRAMUSCULAR | Status: AC
  Filled 2014-04-14: qty 1

## 2014-04-14 MED ORDER — BUPIVACAINE-EPINEPHRINE (PF) 0.25% -1:200000 IJ SOLN
INTRAMUSCULAR | Status: DC | PRN
  Administered 2014-04-14: 22 mL

## 2014-04-14 MED ORDER — MORPHINE SULFATE 2 MG/ML IJ SOLN
2.0000 mg | INTRAMUSCULAR | Status: DC | PRN
  Administered 2014-04-14 (×2): 4 mg via INTRAVENOUS
  Administered 2014-04-14 (×3): 2 mg via INTRAVENOUS
  Administered 2014-04-15 (×3): 4 mg via INTRAVENOUS
  Filled 2014-04-14: qty 1
  Filled 2014-04-14 (×3): qty 2
  Filled 2014-04-14 (×2): qty 1
  Filled 2014-04-14 (×2): qty 2

## 2014-04-14 MED ORDER — MIDAZOLAM HCL 5 MG/5ML IJ SOLN
INTRAMUSCULAR | Status: DC | PRN
  Administered 2014-04-14: 2 mg via INTRAVENOUS

## 2014-04-14 MED ORDER — EPHEDRINE SULFATE 50 MG/ML IJ SOLN
INTRAMUSCULAR | Status: AC
  Filled 2014-04-14: qty 1

## 2014-04-14 MED ORDER — BUPIVACAINE-EPINEPHRINE 0.25% -1:200000 IJ SOLN
INTRAMUSCULAR | Status: AC
  Filled 2014-04-14: qty 1

## 2014-04-14 MED ORDER — LACTATED RINGERS IV SOLN
INTRAVENOUS | Status: DC | PRN
  Administered 2014-04-14: 07:00:00 via INTRAVENOUS

## 2014-04-14 MED ORDER — ROCURONIUM BROMIDE 100 MG/10ML IV SOLN
INTRAVENOUS | Status: AC
  Filled 2014-04-14: qty 1

## 2014-04-14 MED ORDER — UNJURY CHICKEN SOUP POWDER: 2.0000 [oz_av] | Freq: Four times a day (QID) | ORAL | Status: DC

## 2014-04-14 MED ORDER — SODIUM CHLORIDE 0.9 % IV SOLN
INTRAVENOUS | Status: DC | PRN
  Administered 2014-04-14: 70 mL

## 2014-04-14 MED ORDER — 0.9 % SODIUM CHLORIDE (POUR BTL) OPTIME
TOPICAL | Status: DC | PRN
  Administered 2014-04-14: 1000 mL

## 2014-04-14 MED ORDER — LIDOCAINE HCL (CARDIAC) 20 MG/ML IV SOLN
INTRAVENOUS | Status: DC | PRN
  Administered 2014-04-14: 50 mg via INTRAVENOUS

## 2014-04-14 MED ORDER — HYDROMORPHONE HCL 1 MG/ML IJ SOLN
0.2500 mg | INTRAMUSCULAR | Status: DC | PRN
  Administered 2014-04-14 (×2): 0.25 mg via INTRAVENOUS

## 2014-04-14 MED ORDER — SCOPOLAMINE 1 MG/3DAYS TD PT72
MEDICATED_PATCH | TRANSDERMAL | Status: DC | PRN
  Administered 2014-04-14: 1 via TRANSDERMAL

## 2014-04-14 MED ORDER — ACETAMINOPHEN 160 MG/5ML PO SOLN: 650.0000 mg | ORAL | Status: DC | PRN

## 2014-04-14 MED ORDER — UNJURY VANILLA POWDER: 2.0000 [oz_av] | Freq: Four times a day (QID) | ORAL | Status: DC

## 2014-04-14 SURGICAL SUPPLY — 58 items
APPLICATOR COTTON TIP 6IN STRL (MISCELLANEOUS) IMPLANT
APPLIER CLIP ROT 10 11.4 M/L (STAPLE)
BLADE SURG SZ11 CARB STEEL (BLADE) ×2 IMPLANT
CABLE HIGH FREQUENCY MONO STRZ (ELECTRODE) IMPLANT
CHLORAPREP W/TINT 26ML (MISCELLANEOUS) ×4 IMPLANT
CLIP APPLIE ROT 10 11.4 M/L (STAPLE) IMPLANT
DEVICE SUT QUICK LOAD TK 5 (STAPLE) IMPLANT
DEVICE SUT TI-KNOT TK 5X26 (MISCELLANEOUS) IMPLANT
DEVICE SUTURE ENDOST 10MM (ENDOMECHANICALS) IMPLANT
DEVICE TROCAR PUNCTURE CLOSURE (ENDOMECHANICALS) ×2 IMPLANT
DISSECTOR BLUNT TIP ENDO 5MM (MISCELLANEOUS) ×2 IMPLANT
DRAPE CAMERA CLOSED 9X96 (DRAPES) ×2 IMPLANT
DRAPE UTILITY XL STRL (DRAPES) ×4 IMPLANT
ELECT REM PT RETURN 9FT ADLT (ELECTROSURGICAL) ×2
ELECTRODE REM PT RTRN 9FT ADLT (ELECTROSURGICAL) ×1 IMPLANT
GAUZE SPONGE 4X4 12PLY STRL (GAUZE/BANDAGES/DRESSINGS) IMPLANT
GLOVE BIOGEL M STRL SZ7.5 (GLOVE) ×2 IMPLANT
GOWN STRL REUS W/TWL XL LVL3 (GOWN DISPOSABLE) ×10 IMPLANT
HOVERMATT SINGLE USE (MISCELLANEOUS) ×2 IMPLANT
KIT BASIN OR (CUSTOM PROCEDURE TRAY) ×2 IMPLANT
LIQUID BAND (GAUZE/BANDAGES/DRESSINGS) ×2 IMPLANT
NEEDLE SPNL 22GX3.5 QUINCKE BK (NEEDLE) ×2 IMPLANT
PACK UNIVERSAL I (CUSTOM PROCEDURE TRAY) ×2 IMPLANT
PEN SKIN MARKING BROAD (MISCELLANEOUS) ×2 IMPLANT
RELOAD STAPLER 60MM BLK (STAPLE) ×2 IMPLANT
RELOAD STAPLER BLUE 60MM (STAPLE) ×2 IMPLANT
RELOAD STAPLER GOLD 60MM (STAPLE) ×1 IMPLANT
RELOAD STAPLER GREEN 60MM (STAPLE) ×1 IMPLANT
SCISSORS LAP 5X35 DISP (ENDOMECHANICALS) IMPLANT
SCISSORS LAP 5X45 EPIX DISP (ENDOMECHANICALS) ×2 IMPLANT
SEALANT SURGICAL APPL DUAL CAN (MISCELLANEOUS) IMPLANT
SET IRRIG TUBING LAPAROSCOPIC (IRRIGATION / IRRIGATOR) ×2 IMPLANT
SHEARS CURVED HARMONIC AC 45CM (MISCELLANEOUS) ×2 IMPLANT
SLEEVE ADV FIXATION 5X100MM (TROCAR) ×8 IMPLANT
SLEEVE GASTRECTOMY 36FR VISIGI (MISCELLANEOUS) ×2 IMPLANT
SLEEVE XCEL OPT CAN 5 100 (ENDOMECHANICALS) IMPLANT
SOLUTION ANTI FOG 6CC (MISCELLANEOUS) ×2 IMPLANT
SPONGE LAP 18X18 X RAY DECT (DISPOSABLE) ×2 IMPLANT
STAPLER ECHELON BIOABSB 60 FLE (MISCELLANEOUS) ×12 IMPLANT
STAPLER ECHELON LONG 60 440 (INSTRUMENTS) IMPLANT
STAPLER RELOAD 60MM BLK (STAPLE) ×4
STAPLER RELOAD BLUE 60MM (STAPLE) ×4
STAPLER RELOAD GOLD 60MM (STAPLE) ×2
STAPLER RELOAD GREEN 60MM (STAPLE) ×2
SUT MNCRL AB 4-0 PS2 18 (SUTURE) ×4 IMPLANT
SUT SURGIDAC NAB ES-9 0 48 120 (SUTURE) IMPLANT
SUT VICRYL 0 TIES 12 18 (SUTURE) ×2 IMPLANT
SYR 20CC LL (SYRINGE) ×4 IMPLANT
SYR 50ML LL SCALE MARK (SYRINGE) ×2 IMPLANT
TOWEL OR 17X26 10 PK STRL BLUE (TOWEL DISPOSABLE) ×2 IMPLANT
TOWEL OR NON WOVEN STRL DISP B (DISPOSABLE) ×2 IMPLANT
TRAY FOLEY CATH 14FRSI W/METER (CATHETERS) IMPLANT
TROCAR ADV FIXATION 5X100MM (TROCAR) ×2 IMPLANT
TROCAR BLADELESS 15MM (ENDOMECHANICALS) IMPLANT
TROCAR BLADELESS OPT 5 100 (ENDOMECHANICALS) ×2 IMPLANT
TUBING CONNECTING 10 (TUBING) ×2 IMPLANT
TUBING ENDO SMARTCAP (MISCELLANEOUS) ×2 IMPLANT
TUBING FILTER THERMOFLATOR (ELECTROSURGICAL) ×2 IMPLANT

## 2014-04-14 NOTE — Op Note (Signed)
Procedure: Upper GI endoscopy  Description of procedure: Upper GI endoscopy is performed at the completion of laparoscopic sleeve gastrectomy by Dr.  Andrey CampanileWilson.  The video endoscope was introduced into the upper esophagus and then passed to the EG junction at about 42 cm. The esophagus appeared mildly patulous but otherwise normal. The gastric sleeve was entered. The sleeve was tensely distended with air while the outlet was obstructed under saline irrigation by the operating surgeon. There was no evidence of leak. The staple line was intact and without bleeding. The scope was advanced to the antrum and pylorus visualized. There was no stricture or twisting or mucosal abnormality, and particularly no narrowing noted at the incisura.  The pouch was then desufflated and the scope withdrawn.  Mariella SaaBenjamin T Kyiesha Millward MD, FACS  04/14/2014, 9:49 AM

## 2014-04-14 NOTE — Op Note (Signed)
04/14/2014 Juan Winters 07/10/1975 [id number]   PRE-OPERATIVE DIAGNOSIS:   Morbid obesity BMI 52 Gout arthritis OSA on CPAP Dyslipidemia Hypertension Fatty Liver Disease   POST-OPERATIVE DIAGNOSIS:  same  PROCEDURE:  Procedure(s): LAPAROSCOPIC SLEEVE GASTRECTOMY UPPER GI ENDOSCOPY  SURGEON:  Surgeon(s): Atilano InaEric M Anai Lipson, MD FACS FASMBS  ASSISTANTS: Dr Jaclynn GuarneriBen Hoxworth FACS  ANESTHESIA:   general  DRAINS: none   BOUGIE: 36 fr ViSiGi  LOCAL MEDICATIONS USED:  MARCAINE + 70cc Exparel  SPECIMEN:  Source of Specimen:  Greater curvature of stomach  DISPOSITION OF SPECIMEN:  PATHOLOGY  COUNTS:  YES  INDICATION FOR PROCEDURE: This is a very pleasant 39 year old morbidly obese WM who has had unsuccessful attempts for sustained weight loss. He presents today for a planned laparoscopic sleeve gastrectomy with upper endoscopy. We have discussed the risk and benefits of the procedure extensively preoperatively. Please see my separate notes.  PROCEDURE: After obtaining informed consent and receiving 5000 units of subcutaneous heparin, the patient was [facility] and placed supine on the operating room table. General endotracheal anesthesia was established. Sequential compression devices were placed.  A orogastric tube was placed. The patient's abdomen was prepped and draped in the usual standard surgical fashion. He received preoperative IV antibiotics. A surgical timeout was performed.  Access to the abdomen was achieved using a 5 mm 0 laparoscope thru a 5 mm trocar In the left upper Quadrant 2 fingerbreadths below the left subcostal margin using the Optiview technique. Pneumoperitoneum was smoothly established up to 15 mm of mercury. The laparoscope was advanced and the abdominal cavity was surveilled. There was evidence of a small hiatal hernia on UGI.  A 5 mm trocar was placed above and to the left of the umbilicus under direct visualization.  The patient was then placed in reverse Trendelenburg. [facility] liver retractor was placed under the left lobe of the liver through a 5 mm trocar incision site in the subxiphoid position. A 5 mm trocar was placed in the lateral right upper quadrant along with a 15 mm trocar in the mid right abdomen  All under direct visualization after local had been infiltrated.  The stomach was inspected. It was completely decompressed and the orogastric tube was removed.  The patient had a fair amount of peri-gastric adipose tissue. The calibration tube was placed in the oropharynx and guided down into the stomach by the CRNA. 10 mL of air was insufflated into the calibration balloon. The calibration tubing was then gently pulled back by the CRNA and it did not slide past the GE junction. At this point the calibration tubing was desufflated and removed. At this point there was no evidence of a clinically significant hiatal hernia.   We identified the pylorus and measured 5 cm proximal to the pylorus and identified an area of where we would start taking down the short gastric vessels. Harmonic scalpel was used to take down the short gastric vessels along the greater curvature of the stomach. We were able to enter the lesser sac. We continued to march along the greater curvature of the stomach taking down the short gastrics. As we approached the gastrosplenic ligament we took care in this area not to injure the spleen. Because his stomach was very high under his ribs, I did place an additional 5mm trocar superior to the left mid-abdomen camera trocar so we could have better visualization near the hiatus.  We were able to take down the entire gastrosplenic ligament.  We then mobilized the fundus away from the left crus of diaphragm. There was some perigastric adipose tissue posteriorly concerning for a possibility of a hiatal hernia. Therefore I decided to open up the gastrohepatic ligament with harmonic scalpel and dissect  the left and right crura out to see if there was in fact a hiatal hernia. The gastrohepatic ligament was incised with harmonic scalpel. The right crus was identified. We identified the crossing fat along the right crus. The adipose tissue just above this area was incised with harmonic scalpel. I then bluntly dissected out this area and identified the left crus. There was no evidence of a hiatal hernia.There were not any significant posterior gastric avascular attachments. This left the stomach completely mobilized. No vessels had been taken down along the lesser curvature of the stomach.  We then reidentified the pylorus. A 36Fr ViSiGi was then placed in the oropharynx and advanced down into the stomach and placed in the distal antrum and positioned along the lesser curvature. It was placed under suction which secured the 36Fr ViSiGi in place along the lesser curve. Then using the [employer] echelon 60 mm stapler with a black load with Seamguard, I placed a stapler along the antrum approximately 5 cm from the pylorus. The stapler was angled so that there is ample room at the angularis incisura. I then fired the first staple load after inspecting it posteriorly to ensure adequate space both anteriorly and posteriorly. At this point I still was not completely past the angularis so with another black load with Seamguard, I placed the stapler in position just inside the prior stapleline. We then rotated the stomach to insure that there was adequate anteriorly as well as posteriorly. The stapler was then fired. I used a 60mm green cartridge with seamguard. At this point I started using 60 mm gold load staple cartridges with Seamguard. The echelon stapler was then repositioned with a 60 mm gold load with Seamguard and we continued to march up along the ViSiGi. My assistant was holding traction along the greater curvature stomach along the cauterized short gastric vessels ensuring that the stomach was symmetrically  retracted. Prior to each firing of the staple, we rotated the stomach to ensure that there is adequate stomach left.  As we approached the fundus, I used 60 mm blue cartridge with Seamguard aiming slightly lateral to the esophageal fat pad. Although the staples on this fire had completely gone thru the last part of the stomach it had not completely cut it. Therefore 1 additional 60 blue load was used to free the remaining stomach. The sleeve was inspected. There is no evidence of cork screw. The staple line appeared hemostatic. The CRNA inflated the ViSiGi to the green zone and the upper abdomen was flooded with saline. There were no bubbles. The sleeve was decompressed and the ViSiGi removed. My assistant scrubbed out and performed an upper endoscopy. The sleeve easily distended with air and the scope was easily advanced to the pylorus. There is no evidence of internal bleeding or cork screwing. There was no narrowing at the angularis. There is no evidence of bubbles. Please see his operative note for further details. The gastric sleeve was decompressed and the endoscope was removed.  The greater curvature the stomach was grasped with a laparoscopic grasper and removed from the 15 mm trocar site.  The liver retractor was removed. I then closed the 15 mm trocar site with 2 interrupted 0 Vicryl sutures through the fascia using the endoclose. The  closure was viewed laparoscopically and it was airtight. 70 cc of Exparel was then infiltrated in the preperitoneal spaces around the trocar sites. Pneumoperitoneum was released. All trocar sites were closed with a 4-0 Monocryl in a subcuticular fashion followed by the application of Dermabond. The patient was extubated and taken to the recovery room in stable condition. All needle, instrument, and sponge counts were correct x2. There are no immediate complications  (2) 60 mm black with seaguard (1) 60 mm green with Seamguard (1) 60 mm gold with seamguard (2) 60 mm blue  with seamguard  PLAN OF CARE: Admit to inpatient   PATIENT DISPOSITION:  PACU - hemodynamically stable.   Delay start of Pharmacological VTE agent (>24hrs) due to surgical blood loss or risk of bleeding:  no  Mary Sella. Andrey Campanile, MD, FACS General, Bariatric, & Minimally Invasive Surgery [facility], [place]

## 2014-04-14 NOTE — Anesthesia Procedure Notes (Signed)
Procedure Name: Intubation Date/Time: 04/14/2014 7:39 AM Performed by: Paris LoreBLANTON, Suzanne Garbers M Pre-anesthesia Checklist: Patient identified, Emergency Drugs available, Suction available, Patient being monitored and Timeout performed Patient Re-evaluated:Patient Re-evaluated prior to inductionOxygen Delivery Method: Circle system utilized Preoxygenation: Pre-oxygenation with 100% oxygen Intubation Type: IV induction Ventilation: Mask ventilation without difficulty Laryngoscope Size: Mac and 4 Grade View: Grade I Tube type: Oral Tube size: 7.5 mm Number of attempts: 1 Airway Equipment and Method: Stylet Placement Confirmation: ETT inserted through vocal cords under direct vision,  positive ETCO2,  CO2 detector and breath sounds checked- equal and bilateral Tube secured with: Tape Dental Injury: Teeth and Oropharynx as per pre-operative assessment

## 2014-04-14 NOTE — Interval H&P Note (Signed)
History and Physical Interval Note:  04/14/2014 7:26 AM  Wende BushyJames C Bohorquez  has presented today for surgery, with the diagnosis of Morbid Obesity  The various methods of treatment have been discussed with the patient and family. After consideration of risks, benefits and other options for treatment, the patient has consented to  Procedure(s): LAPAROSCOPIC GASTRIC SLEEVE RESECTION WITH HIATAL HERNIA REPAIR (N/A) as a surgical intervention .  The patient's history has been reviewed, patient examined, no change in status, stable for surgery.  I have reviewed the patient's chart and labs.  Questions were answered to the patient's satisfaction.   Mary SellaEric M. Andrey CampanileWilson, MD, FACS General, Bariatric, & Minimally Invasive Surgery [facility], [place]   [facility] Center[facility]IL[facility],Rajah Lamba M

## 2014-04-14 NOTE — Progress Notes (Signed)
Pt stated that he would self administer CPAP when ready for bed.  Current settings are Auto CPAP - CMH20 per Pt home settings via home nasal pillows and tubing.  RT to monitor and assess as needed.

## 2014-04-14 NOTE — Anesthesia Preprocedure Evaluation (Signed)
Anesthesia Evaluation  Patient identified by MRN, date of birth, ID band Patient awake    Reviewed: Allergy & Precautions, H&P , NPO status , Patient's Chart, lab work & pertinent test results  History of Anesthesia Complications (+) PONV  Airway Mallampati: II  TM Distance: >3 FB Neck ROM: Full    Dental no notable dental hx. (+) Teeth Intact, Dental Advisory Given   Pulmonary sleep apnea and Continuous Positive Airway Pressure Ventilation ,  breath sounds clear to auscultation  Pulmonary exam normal       Cardiovascular hypertension, Pt. on medications negative cardio ROS  Rhythm:Regular Rate:Normal     Neuro/Psych negative neurological ROS  negative psych ROS   GI/Hepatic negative GI ROS, Neg liver ROS,   Endo/Other  Morbid obesity  Renal/GU negative Renal ROS  negative genitourinary   Musculoskeletal   Abdominal   Peds  Hematology negative hematology ROS (+)   Anesthesia Other Findings   Reproductive/Obstetrics negative OB ROS                             Anesthesia Physical Anesthesia Plan  ASA: III  Anesthesia Plan: General   Post-op Pain Management:    Induction: Intravenous  Airway Management Planned: Oral ETT  Additional Equipment:   Intra-op Plan:   Post-operative Plan: Extubation in OR  Informed Consent: I have reviewed the patients History and Physical, chart, labs and discussed the procedure including the risks, benefits and alternatives for the proposed anesthesia with the patient or authorized representative who has indicated his/her understanding and acceptance.   Dental advisory given  Plan Discussed with: CRNA  Anesthesia Plan Comments:         Anesthesia Quick Evaluation

## 2014-04-14 NOTE — Transfer of Care (Signed)
Immediate Anesthesia Transfer of Care Note  Patient: Juan Winters  Procedure(s) Performed: Procedure(s) (LRB): LAPAROSCOPIC GASTRIC SLEEVE RESECTION WITH HIATAL HERNIA REPAIR AND UPPER ENDOSCOPY (N/A)  Patient Location: PACU  Anesthesia Type: General  Level of Consciousness: sedated, patient cooperative and responds to stimulation  Airway & Oxygen Therapy: Patient Spontanous Breathing and Patient connected to face mask oxgen  Post-op Assessment: Report given to PACU RN and Post -op Vital signs reviewed and stable  Post vital signs: Reviewed and stable  Complications: No apparent anesthesia complications

## 2014-04-14 NOTE — Anesthesia Postprocedure Evaluation (Signed)
  Anesthesia Post-op Note  Patient: Juan Winters  Procedure(s) Performed: Procedure(s): LAPAROSCOPIC GASTRIC SLEEVE RESECTION WITH UPPER ENDOSCOPY (N/A)  Patient Location: PACU  Anesthesia Type:General  Level of Consciousness: awake and alert   Airway and Oxygen Therapy: Patient Spontanous Breathing  Post-op Pain: mild  Post-op Assessment: Post-op Vital signs reviewed, Patient's Cardiovascular Status Stable and Respiratory Function Stable  Post-op Vital Signs: Reviewed  Filed Vitals:   04/14/14 1200  BP: 135/74  Pulse: 69  Temp: 36.4 C  Resp: 18    Complications: No apparent anesthesia complications

## 2014-04-15 ENCOUNTER — Encounter (HOSPITAL_COMMUNITY): Payer: Self-pay | Admitting: General Surgery

## 2014-04-15 ENCOUNTER — Inpatient Hospital Stay (HOSPITAL_COMMUNITY): Payer: 59

## 2014-04-15 LAB — HEMOGLOBIN AND HEMATOCRIT, BLOOD
HEMATOCRIT: 42.9 % (ref 39.0–52.0)
HEMOGLOBIN: 14.3 g/dL (ref 13.0–17.0)

## 2014-04-15 LAB — CBC WITH DIFFERENTIAL/PLATELET
BASOS ABS: 0 10*3/uL (ref 0.0–0.1)
Basophils Relative: 0 % (ref 0–1)
EOS PCT: 0 % (ref 0–5)
Eosinophils Absolute: 0 10*3/uL (ref 0.0–0.7)
HCT: 42.7 % (ref 39.0–52.0)
Hemoglobin: 13.8 g/dL (ref 13.0–17.0)
LYMPHS PCT: 13 % (ref 12–46)
Lymphs Abs: 1.3 10*3/uL (ref 0.7–4.0)
MCH: 29.9 pg (ref 26.0–34.0)
MCHC: 32.3 g/dL (ref 30.0–36.0)
MCV: 92.4 fL (ref 78.0–100.0)
Monocytes Absolute: 0.7 10*3/uL (ref 0.1–1.0)
Monocytes Relative: 7 % (ref 3–12)
NEUTROS ABS: 8.3 10*3/uL — AB (ref 1.7–7.7)
Neutrophils Relative %: 80 % — ABNORMAL HIGH (ref 43–77)
Platelets: 178 10*3/uL (ref 150–400)
RBC: 4.62 MIL/uL (ref 4.22–5.81)
RDW: 13.7 % (ref 11.5–15.5)
WBC: 10.3 10*3/uL (ref 4.0–10.5)

## 2014-04-15 LAB — COMPREHENSIVE METABOLIC PANEL
ALT: 139 U/L — AB (ref 0–53)
AST: 95 U/L — ABNORMAL HIGH (ref 0–37)
Albumin: 3.7 g/dL (ref 3.5–5.2)
Alkaline Phosphatase: 40 U/L (ref 39–117)
Anion gap: 6 (ref 5–15)
BILIRUBIN TOTAL: 0.5 mg/dL (ref 0.3–1.2)
BUN: 9 mg/dL (ref 6–23)
CALCIUM: 8.5 mg/dL (ref 8.4–10.5)
CHLORIDE: 100 mmol/L (ref 96–112)
CO2: 27 mmol/L (ref 19–32)
Creatinine, Ser: 0.78 mg/dL (ref 0.50–1.35)
GLUCOSE: 137 mg/dL — AB (ref 70–99)
Potassium: 4 mmol/L (ref 3.5–5.1)
SODIUM: 133 mmol/L — AB (ref 135–145)
Total Protein: 7.3 g/dL (ref 6.0–8.3)

## 2014-04-15 MED ORDER — UNJURY CHICKEN SOUP POWDER: 2.0000 [oz_av] | Freq: Four times a day (QID) | ORAL | Status: DC

## 2014-04-15 MED ORDER — UNJURY VANILLA POWDER: 2.0000 [oz_av] | Freq: Four times a day (QID) | ORAL | Status: DC

## 2014-04-15 MED ORDER — IOHEXOL 300 MG/ML  SOLN
50.0000 mL | Freq: Once | INTRAMUSCULAR | Status: AC | PRN
  Administered 2014-04-15: 50 mL via ORAL

## 2014-04-15 MED ORDER — ALUM & MAG HYDROXIDE-SIMETH 200-200-20 MG/5ML PO SUSP: 15.0000 mL | Freq: Four times a day (QID) | ORAL | Status: DC | PRN

## 2014-04-15 MED ORDER — UNJURY CHOCOLATE CLASSIC POWDER
2.0000 [oz_av] | Freq: Four times a day (QID) | ORAL | Status: DC
  Administered 2014-04-15 – 2014-04-16 (×2): 2 [oz_av] via ORAL

## 2014-04-15 NOTE — Progress Notes (Signed)
Pt using CPAP QHS.  Pt states will place himself on CPAP when he is ready.  CPAP plugged into red outlet and humidifier filled with sterile water.  Machine cut on with 2 lpm of O2 being bled into the tubing.  Machine ready for Pt to place nasal pillows on himself when he is ready.

## 2014-04-15 NOTE — Plan of Care (Signed)
Problem: Food- and Nutrition-Related Knowledge Deficit (NB-1.1) Goal: Nutrition education Formal process to instruct or train a patient/client in a skill or to impart knowledge to help patients/clients voluntarily manage or modify food choices and eating behavior to maintain or improve health. Outcome: Completed/Met Date Met:  04/15/14 Nutrition Education Note  Received consult for diet education per DROP protocol.   Discussed 2 week post op diet with pt. Emphasized that liquids must be non carbonated, non caffeinated, and sugar free. Fluid goals discussed. Pt to follow up with outpatient bariatric RD for further diet progression after 2 weeks. Multivitamins and minerals also reviewed. Teach back method used, pt expressed understanding, expect good compliance.   Diet: First 2 Weeks  You will see the nutritionist about two (2) weeks after your surgery. The nutritionist will increase the types of foods you can eat if you are handling liquids well:  If you have severe vomiting or nausea and cannot handle clear liquids lasting longer than 1 day, call your surgeon  Protein Shake  Drink at least 2 ounces of shake 5-6 times per day  Each serving of protein shakes (usually 8 - 12 ounces) should have a minimum of:  15 grams of protein  And no more than 5 grams of carbohydrate  Goal for protein each day:  Men = 80 grams per day  Women = 60 grams per day  Protein powder may be added to fluids such as non-fat milk or Lactaid milk or Soy milk (limit to 35 grams added protein powder per serving)   Hydration  Slowly increase the amount of water and other clear liquids as tolerated (See Acceptable Fluids)  Slowly increase the amount of protein shake as tolerated  Sip fluids slowly and throughout the day  May use sugar substitutes in small amounts (no more than 6 - 8 packets per day; i.e. Splenda)   Fluid Goal  The first goal is to drink at least 8 ounces of protein shake/drink per day (or as directed  by the nutritionist); some examples of protein shakes are [employer], [employer], EAS Edge HP, and Unjury. See handout from pre-op Bariatric Education Class:  Slowly increase the amount of protein shake you drink as tolerated  You may find it easier to slowly sip shakes throughout the day  It is important to get your proteins in first  Your fluid goal is to drink 64 - 100 ounces of fluid daily  It may take a few weeks to build up to this  32 oz (or more) should be clear liquids  And  32 oz (or more) should be full liquids (see below for examples)  Liquids should not contain sugar, caffeine, or carbonation   Clear Liquids:  Water or Sugar-free flavored water (i.e. Fruit H2O, Propel)  Decaffeinated coffee or tea (sugar-free)  Crystal Lite, Wyler's Lite, Minute Maid Lite  Sugar-free Jell-O  Bouillon or broth  Sugar-free Popsicle: *Less than 20 calories each; Limit 1 per day   Full Liquids:  Protein Shakes/Drinks + 2 choices per day of other full liquids  Full liquids must be:  No More Than 12 grams of Carbs per serving  No More Than 3 grams of Fat per serving  Strained low-fat cream soup  Non-Fat milk  Fat-free Lactaid Milk  Sugar-free yogurt (Dannon Lite & Fit, Greek yogurt)     Clayton Bibles, MS, RD, LDN Pager: [phone] After Hours Pager: [phone]

## 2014-04-15 NOTE — Progress Notes (Signed)
Patient alert and oriented, Post op day 1.  Provided support and encouragement.  Encouraged pulmonary toilet, ambulation and small sips of liquids when swallow study returned satisfactorily.  All questions answered.  Will continue to monitor. 

## 2014-04-15 NOTE — Progress Notes (Signed)
1 Day Post-Op  Subjective: Had a fair amount of nausea yesterday but less today. Just sore. Pain ok. Ambulated in hall  Objective: Vital signs in last 24 hours: Temp:  [97.6 F (36.4 C)-99.4 F (37.4 C)] 98.1 F (36.7 C) (02/23 0551) Pulse Rate:  [61-89] 73 (02/23 0551) Resp:  [10-20] 18 (02/23 0551) BP: (124-158)/(67-94) 151/81 mmHg (02/23 0551) SpO2:  [91 %-99 %] 94 % (02/23 0551) Weight:  [405 lb 8 oz (183.934 kg)] 405 lb 8 oz (183.934 kg) (02/23 0640) Last BM Date: 04/13/14  Intake/Output from previous day: 02/22 0701 - 02/23 0700 In: 2914.6 [I.V.:2914.6] Out: 900 [Urine:900] Intake/Output this shift:    Alert, nad, nontoxic cta b/l Reg Obese, soft, approp TTP; incisions c/d/i No edema  Lab Results:   Recent Labs  04/14/14 1045 04/15/14 0540  WBC  --  10.3  HGB 13.9 13.8  HCT 41.8 42.7  PLT  --  178   BMET  Recent Labs  04/15/14 0540  NA 133*  K 4.0  CL 100  CO2 27  GLUCOSE 137*  BUN 9  CREATININE 0.78  CALCIUM 8.5   PT/INR No results for input(s): LABPROT, INR in the last 72 hours. ABG No results for input(s): PHART, HCO3 in the last 72 hours.  Invalid input(s): PCO2, PO2  Studies/Results: No results found.  Anti-infectives: Anti-infectives    Start     Dose/Rate Route Frequency Ordered Stop   04/14/14 0536  cefoTEtan in Dextrose 5% (CEFOTAN) IVPB 2 g     2 g Intravenous On call to O.R. 04/14/14 0536 04/14/14 0752      Assessment/Plan: s/p Procedure(s): LAPAROSCOPIC GASTRIC SLEEVE RESECTION WITH UPPER ENDOSCOPY (N/A)  Looks good. No fever or tachycardia.  hgb ok For UGI this am, if ok will start pod 1 diet Cont vte prophylaxis Ambulate, IS CPAP at night.  Believe transaminase elevation due to fatty liver and liver retraction  Mary SellaEric M. Andrey CampanileWilson, MD, FACS General, Bariatric, & Minimally Invasive Surgery [facility], [place]   LOS: 1 day    Atilano InaWILSON,Kennede Lusk M 04/15/2014

## 2014-04-16 DIAGNOSIS — E785 Hyperlipidemia, unspecified: Secondary | ICD-10-CM | POA: Diagnosis present

## 2014-04-16 DIAGNOSIS — Z9989 Dependence on other enabling machines and devices: Secondary | ICD-10-CM

## 2014-04-16 DIAGNOSIS — I10 Essential (primary) hypertension: Secondary | ICD-10-CM | POA: Diagnosis present

## 2014-04-16 DIAGNOSIS — G4733 Obstructive sleep apnea (adult) (pediatric): Secondary | ICD-10-CM | POA: Diagnosis present

## 2014-04-16 DIAGNOSIS — Z8249 Family history of ischemic heart disease and other diseases of the circulatory system: Secondary | ICD-10-CM

## 2014-04-16 DIAGNOSIS — K760 Fatty (change of) liver, not elsewhere classified: Secondary | ICD-10-CM | POA: Diagnosis present

## 2014-04-16 DIAGNOSIS — M109 Gout, unspecified: Secondary | ICD-10-CM | POA: Diagnosis present

## 2014-04-16 LAB — CBC WITH DIFFERENTIAL/PLATELET
BASOS PCT: 0 % (ref 0–1)
Basophils Absolute: 0 10*3/uL (ref 0.0–0.1)
Eosinophils Absolute: 0 10*3/uL (ref 0.0–0.7)
Eosinophils Relative: 0 % (ref 0–5)
HCT: 42.7 % (ref 39.0–52.0)
Hemoglobin: 14 g/dL (ref 13.0–17.0)
LYMPHS PCT: 20 % (ref 12–46)
Lymphs Abs: 1.9 10*3/uL (ref 0.7–4.0)
MCH: 30.3 pg (ref 26.0–34.0)
MCHC: 32.8 g/dL (ref 30.0–36.0)
MCV: 92.4 fL (ref 78.0–100.0)
Monocytes Absolute: 0.9 10*3/uL (ref 0.1–1.0)
Monocytes Relative: 9 % (ref 3–12)
NEUTROS ABS: 6.9 10*3/uL (ref 1.7–7.7)
Neutrophils Relative %: 71 % (ref 43–77)
PLATELETS: 160 10*3/uL (ref 150–400)
RBC: 4.62 MIL/uL (ref 4.22–5.81)
RDW: 13.9 % (ref 11.5–15.5)
WBC: 9.7 10*3/uL (ref 4.0–10.5)

## 2014-04-16 MED ORDER — OXYCODONE HCL 5 MG/5ML PO SOLN: 5.0000 mg | ORAL | Status: AC | PRN

## 2014-04-16 MED ORDER — ENOXAPARIN SODIUM 40 MG/0.4ML ~~LOC~~ SOLN: 40.0000 mg | SUBCUTANEOUS | Status: AC

## 2014-04-16 MED ORDER — PANTOPRAZOLE SODIUM 40 MG PO TBEC: 40.0000 mg | DELAYED_RELEASE_TABLET | Freq: Every day | ORAL | Status: AC

## 2014-04-16 MED ORDER — ONDANSETRON HCL 4 MG PO TABS: 4.0000 mg | ORAL_TABLET | Freq: Three times a day (TID) | ORAL | Status: AC | PRN

## 2014-04-16 MED ORDER — HYDROCHLOROTHIAZIDE 12.5 MG PO CAPS
12.5000 mg | ORAL_CAPSULE | Freq: Every morning | ORAL | Status: DC
  Administered 2014-04-16: 12.5 mg via ORAL
  Filled 2014-04-16: qty 1

## 2014-04-16 MED ORDER — ENOXAPARIN (LOVENOX) PATIENT EDUCATION KIT
PACK | Freq: Once | Status: AC
  Administered 2014-04-16: 11:00:00
  Filled 2014-04-16: qty 1

## 2014-04-16 NOTE — Discharge Instructions (Signed)

## 2014-04-16 NOTE — Progress Notes (Signed)
Pt discharged home with wife. IV dc's. Discharge teaching reviewed and pt verbalized understanding. Incisions are clean and dry. Pt reports no pain. Lovenox teaching done, pt and wife verbalized understanding. Pt and wife have no further questions or concerns at this time.

## 2014-04-16 NOTE — Discharge Summary (Signed)
Physician Discharge Summary  Wende BushyJames C Stiefel MVH:[id number]:[id number] DOB: Aug 09, 1975 DOA: 04/14/2014  PCP: Emeterio ReeveWOLTERS,SHARON A, MD  Admit date: 04/14/2014 Discharge date: 04/16/2014  Recommendations for Outpatient Follow-up:   Follow-up Information    Follow up with Atilano InaWILSON,Ladoris Lythgoe M, MD On 05/01/2014.   Specialty:  General Surgery   Why:  2 pm (arrive 1:45pm) , For wound re-check   Contact information:   [address] [place] [address] [phone]      Discharge Diagnoses:  Principal Problem:   S/P laparoscopic sleeve gastrectomy Active Problems:   Morbid obesity with BMI of 50.0-59.9, adult   Fatty liver   OSA on CPAP   HTN (hypertension)   Gout, arthritis   Dyslipidemia   Family history of DVT   Surgical Procedure: Laparoscopic Sleeve Gastrectomy, upper endoscopy  Discharge Condition: Good Disposition: Home  Diet recommendation: Postoperative sleeve gastrectomy diet (liquids only)  Filed Weights   04/14/14 0526 04/15/14 0640 04/16/14 0600  Weight: 416 lb 4 oz (188.81 kg) 405 lb 8 oz (183.934 kg) 420 lb 9.6 oz (190.783 kg)     Hospital Course:  The patient was admitted for a planned laparoscopic sleeve gastrectomy. Please see operative note. Preoperatively the patient was given 5000 units of subcutaneous heparin for DVT prophylaxis. Postoperative prophylactic Lovenox dosing was started on the morning of postoperative day 1. The patient underwent an upper GI on postoperative day 1 which demonstrated no extravasation of contrast and emptying of the contrast into the small bowel. The patient was started on ice chips and water which they tolerated. On postoperative day 2 The patient's diet was advanced to protein shakes which they also tolerated. The patient was ambulating without difficulty. Their vital signs are stable without fever or tachycardia. Their hemoglobin had remained stable.The patient was maintained on their home settings for CPAP therapy. The patient had  received discharge instructions and counseling. They were deemed stable for discharge.   Discharge Instructions  Discharge Instructions    Ambulate hourly while awake    Complete by:  As directed      Call MD for:  difficulty breathing, headache or visual disturbances    Complete by:  As directed      Call MD for:  persistant dizziness or light-headedness    Complete by:  As directed      Call MD for:  persistant nausea and vomiting    Complete by:  As directed      Call MD for:  redness, tenderness, or signs of infection (pain, swelling, redness, odor or green/yellow discharge around incision site)    Complete by:  As directed      Call MD for:  severe uncontrolled pain    Complete by:  As directed      Call MD for:  temperature >101 F    Complete by:  As directed      Diet bariatric full liquid    Complete by:  As directed      Discharge instructions    Complete by:  As directed   See bariatric discharge instructions     Incentive spirometry    Complete by:  As directed   Perform hourly while awake            Medication List    TAKE these medications        allopurinol 100 MG tablet  Commonly known as:  ZYLOPRIM  Take 200 mg by mouth daily.     colchicine 0.6 MG tablet  Take 0.6 mg by mouth daily as needed (FOR GOUT).     enoxaparin 40 MG/0.4ML injection  Commonly known as:  LOVENOX  Inject 0.4 mLs (40 mg total) into the skin daily.     hydrochlorothiazide 12.5 MG tablet  Commonly known as:  HYDRODIURIL  Take 12.5 mg by mouth every morning.     ondansetron 4 MG tablet  Commonly known as:  ZOFRAN  Take 1 tablet (4 mg total) by mouth every 8 (eight) hours as needed for nausea or vomiting.     oxyCODONE 5 MG/5ML solution  Commonly known as:  ROXICODONE  Take 5-10 mLs (5-10 mg total) by mouth every 4 (four) hours as needed for moderate pain or severe pain.     pantoprazole 40 MG tablet  Commonly known as:  PROTONIX  Take 1 tablet (40 mg total) by mouth  daily.     vitamin B-12 1000 MCG tablet  Commonly known as:  CYANOCOBALAMIN  Take 1,000 mcg by mouth daily.           Follow-up Information    Follow up with Atilano Ina, MD On 05/01/2014.   Specialty:  General Surgery   Why:  2 pm (arrive 1:45pm) , For wound re-check   Contact information:   [address] [place] [address] [phone]        The results of significant diagnostics from this hospitalization (including imaging, microbiology, ancillary and laboratory) are listed below for reference.    Significant Diagnostic Studies: Dg Ugi W/water Sol Cm  04/15/2014   CLINICAL DATA:  One day postop from gastric sleeve bariatric surgery for morbid obesity.  EXAM: WATER SOLUBLE UPPER GI SERIES  TECHNIQUE: Single-column upper GI series was performed using water soluble contrast.  CONTRAST:  50mL OMNIPAQUE IOHEXOL 300 MG/ML  SOLN  COMPARISON:  None.  FLUOROSCOPY TIME:  Fluoroscopy Time (in minutes and seconds): 1 minutes 48 seconds  Number of Acquired Images:  10  FINDINGS: The esophagus is normal in appearance. No evidence of esophageal mass or stricture.  Expected postoperative appearance of gastric sleeve is seen. There is no evidence of contrast leak or extravasation. There is no evidence of obstruction. Prompt gastric emptying is seen. The duodenum is also unremarkable in appearance.  IMPRESSION: Expected postoperative appearance of gastric sleeve. No evidence of contrast leak, obstruction, or other complication.   Electronically Signed   By: Myles Rosenthal M.D.   On: 04/15/2014 11:20    Labs: Basic Metabolic Panel:  Recent Labs Lab 04/15/14 0540  NA 133*  K 4.0  CL 100  CO2 27  GLUCOSE 137*  BUN 9  CREATININE 0.78  CALCIUM 8.5   Liver Function Tests:  Recent Labs Lab 04/15/14 0540  AST 95*  ALT 139*  ALKPHOS 40  BILITOT 0.5  PROT 7.3  ALBUMIN 3.7    CBC:  Recent Labs Lab 04/14/14 1045 04/15/14 0540 04/15/14 1640 04/16/14 0551  WBC  --   10.3  --  9.7  NEUTROABS  --  8.3*  --  6.9  HGB 13.9 13.8 14.3 14.0  HCT 41.8 42.7 42.9 42.7  MCV  --  92.4  --  92.4  PLT  --  178  --  160    CBG: No results for input(s): GLUCAP in the last 168 hours.  Principal Problem:   S/P laparoscopic sleeve gastrectomy Active Problems:   Morbid obesity with BMI of 50.0-59.9, adult   Fatty liver   OSA on CPAP  HTN (hypertension)   Gout, arthritis   Dyslipidemia   Family history of DVT   Time coordinating discharge: 15 minutes  Signed:  Atilano Ina, MD [facility], [place] [phone] 04/16/2014, 12:18 PM

## 2014-04-16 NOTE — Care Management Note (Signed)
    Page 1 of 1   04/16/2014     10:39:05 AM CARE MANAGEMENT NOTE 04/16/2014  Patient:  Juan Winters,Juan Winters   Account Number:  [id number]  Date Initiated:  04/15/2014  Documentation initiated by:  Lorenda IshiharaPEELE,Letta Cargile  Subjective/Objective Assessment:   39 yo male admitted s/p sleeve gastrectomy. PTA lived at home with spouse.     Action/Plan:   Home when stable   Anticipated DC Date:  04/17/2014   Anticipated DC Plan:  HOME/SELF CARE      DC Planning Services  CM consult      Choice offered to / List presented to:             Status of service:  Completed, signed off Medicare Important Message given?   (If response is "NO", the following Medicare IM given date fields will be blank) Date Medicare IM given:   Medicare IM given by:   Date Additional Medicare IM given:   Additional Medicare IM given by:    Discharge Disposition:  HOME/SELF CARE  Per UR Regulation:  Reviewed for med. necessity/level of care/duration of stay  If discussed at Long Length of Stay Meetings, dates discussed:    Comments:  consult for lovenox, covered by insurance provider, per RN patient able to give own Lovenox injections.

## 2014-04-16 NOTE — Progress Notes (Signed)
2 Days Post-Op  Subjective: Doing well. No n/v. Tolerated water and shake. Gets full quickly. Pain a lot better. ambulated  Objective: Vital signs in last 24 hours: Temp:  [98.2 F (36.8 C)-99.5 F (37.5 C)] 98.2 F (36.8 C) (02/24 0600) Pulse Rate:  [74-81] 81 (02/24 0600) Resp:  [16-20] 18 (02/24 0600) BP: (149-176)/(83-94) 176/88 mmHg (02/24 0600) SpO2:  [91 %-97 %] 97 % (02/24 0600) Weight:  [420 lb 9.6 oz (190.783 kg)] 420 lb 9.6 oz (190.783 kg) (02/24 0600) Last BM Date: 04/13/14  Intake/Output from previous day: 02/23 0701 - 02/24 0700 In: 3000 [I.V.:3000] Out: 2500 [Urine:2500] Intake/Output this shift:    Asleep, easily awakens cta b/l Reg Obese, soft, min TTP; incision c/d/i +SCDs, no edema  Lab Results:   Recent Labs  04/15/14 0540 04/15/14 1640 04/16/14 0551  WBC 10.3  --  9.7  HGB 13.8 14.3 14.0  HCT 42.7 42.9 42.7  PLT 178  --  160   BMET  Recent Labs  04/15/14 0540  NA 133*  K 4.0  CL 100  CO2 27  GLUCOSE 137*  BUN 9  CREATININE 0.78  CALCIUM 8.5   PT/INR No results for input(s): LABPROT, INR in the last 72 hours. ABG No results for input(s): PHART, HCO3 in the last 72 hours.  Invalid input(s): PCO2, PO2  Studies/Results: Dg Ugi W/water Sol Cm  04/15/2014   CLINICAL DATA:  One day postop from gastric sleeve bariatric surgery for morbid obesity.  EXAM: WATER SOLUBLE UPPER GI SERIES  TECHNIQUE: Single-column upper GI series was performed using water soluble contrast.  CONTRAST:  50mL OMNIPAQUE IOHEXOL 300 MG/ML  SOLN  COMPARISON:  None.  FLUOROSCOPY TIME:  Fluoroscopy Time (in minutes and seconds): 1 minutes 48 seconds  Number of Acquired Images:  10  FINDINGS: The esophagus is normal in appearance. No evidence of esophageal mass or stricture.  Expected postoperative appearance of gastric sleeve is seen. There is no evidence of contrast leak or extravasation. There is no evidence of obstruction. Prompt gastric emptying is seen. The  duodenum is also unremarkable in appearance.  IMPRESSION: Expected postoperative appearance of gastric sleeve. No evidence of contrast leak, obstruction, or other complication.   Electronically Signed   By: Myles RosenthalJohn  Stahl M.D.   On: 04/15/2014 11:20    Anti-infectives: Anti-infectives    Start     Dose/Rate Route Frequency Ordered Stop   04/14/14 0536  cefoTEtan in Dextrose 5% (CEFOTAN) IVPB 2 g     2 g Intravenous On call to O.R. 04/14/14 0536 04/14/14 0752      Assessment/Plan: s/p Procedure(s): LAPAROSCOPIC GASTRIC SLEEVE RESECTION WITH UPPER ENDOSCOPY (N/A)  Looks good Cont POD2 diet Will check on around lunch and make final decision about discharge, but looks good for dc later today Will send home on prophylactic lovenox 40mg  subcu once a day for 2 weeks  Seth Higginbotham M. Andrey CampanileWilson, MD, FACS General, Bariatric, & Minimally Invasive Surgery [facility] [place], [place]   LOS: 2 days    Atilano InaWILSON,Carime Dinkel M 04/16/2014

## 2014-04-16 NOTE — Progress Notes (Signed)
Patient alert and oriented, pain is controlled. Patient is tolerating fluids,  advanced to protein shake last night and patient has tolerated well.  Reviewed Gastric sleeve discharge instructions with patient and patient is able to articulate understanding.  Provided information on BELT program, Support Group and [facility] outpatient pharmacy. All questions answered, will continue to monitor.

## 2014-04-18 ENCOUNTER — Telehealth (HOSPITAL_COMMUNITY): Payer: Self-pay

## 2014-04-18 NOTE — Telephone Encounter (Signed)
  Made discharge phone call to patient per DROP protocol. Asking the following questions.    1. Do you have someone to care for you now that you are home?  yes 2. Are you having pain now that is not relieved by your pain medication?  no 3. Are you able to drink the recommended daily amount of fluids (48 ounces minimum/day) and protein (60-80 grams/day) as prescribed by the dietitian or nutritional counselor?  Got up to 32 oz of protein, but no clears; today had jell-o 4. Are you taking the vitamins and minerals as prescribed?  Yes, no problems 5. Do you have the "on call" number to contact your surgeon if you have a problem or question?  yes 6. Are your incisions free of redness, swelling or drainage? (If steri strips, address that these can fall off, shower as tolerated) no 7. Have your bowels moved since your surgery?  If not, are you passing gas?  yes 8. Are you up and walking 3-4 times per day?  yes    1. Do you have an appointment made to see your surgeon in the next month?  yes 2. Were you provided your discharge medications before your surgery or before you were discharged from the hospital and are you taking them without problem?  yes 3. Were you provided phone numbers to the clinic/surgeon's office?  yes 4. Did you watch the patient education video module in the (clinic, surgeon's office, etc.) before your surgery? Yes 5. Do you have a discharge checklist that was provided to you in the hospital to reference with instructions on how to take care of yourself after surgery?  Yes 6. Did you see a dietitian or nutritional counselor while you were in the hospital?  yes 7. Do you have an appointment to see a dietitian or nutritional counselor in the next month?  yes

## 2014-04-29 ENCOUNTER — Encounter: Payer: 59 | Attending: General Surgery

## 2014-04-29 VITALS — Ht 74.0 in | Wt 387.5 lb

## 2014-04-29 DIAGNOSIS — Z6841 Body Mass Index (BMI) 40.0 and over, adult: Secondary | ICD-10-CM | POA: Diagnosis not present

## 2014-04-29 DIAGNOSIS — Z713 Dietary counseling and surveillance: Secondary | ICD-10-CM | POA: Insufficient documentation

## 2014-04-29 NOTE — Progress Notes (Signed)
  Follow-up visit:  2 Weeks Post-Operative Sleeve Gastrectomy Surgery  Medical Nutrition Therapy:  Appt start time: 0335 end time:  3317.  Primary concerns today: Post-operative Bariatric Surgery Nutrition Management. Feeling great, having no pain, tolerating soft protein.   Surgery date: 04/14/2014 Surgery type: Gastric sleeve Start weight at [facility]: 422 lbs on 01/23/14  Weight today: 387.5 lbs  Weight change: 32 lbs  TANITA  BODY COMP RESULTS  03/03/14 3/816   BMI (kg/m^2) 53.9 49.8   Fat Mass (lbs) 259.5 230.0   Fat Free Mass (lbs) 160 157.5   Total Body Water (lbs) 117 115.5    Preferred Learning Style:   No preference indicated   Learning Readiness:   Ready  24-hr recall: B (AM):triple zero yogurt (15 g) and isopure protein (40 g) Snk (AM): none L (PM): 1-2 oz sandwich meat with cheese (7-14 g) Snk (PM): none D (PM):1-3 oz tuna, chicken salad (7-21 g) Snk (PM): none  Fluid intake: 16 oz isopure, 60 oz water (over 64 oz total) Estimated total protein intake: 69 - 90 g  Medications: see list Supplementation:  taking  Using straws: No Drinking while eating: Yes - trying to stop Hair loss: No Carbonated beverages: No N/V/D/C: No Dumping syndrome: No  Recent physical activity:  Walking daily for about 5-10 minutes  Progress Towards Goal(s):  In progress.  Handouts given during visit include:  Phase 3A High Protein Diet   Nutritional Diagnosis:  Box-3.3 Overweight/obesity related to past poor dietary habits and physical inactivity as evidenced by patient w/ recent Sleeve Gastrectomy surgery following dietary guidelines for continued weight loss.    Intervention:  Nutrition education/diet advancement   The following the learning objectives were met by the patient during this visit:  Identifies Phase 3A (Soft, High Proteins) Dietary Goals and will begin from 2 weeks post-operatively to 2 months post-operatively  Identifies appropriate sources of fluids and  proteins   States protein recommendations and appropriate sources post-operatively  Identifies the need for appropriate texture modifications, mastication, and bite sizes when consuming solids  Identifies appropriate multivitamin and calcium sources post-operatively  Describes the need for physical activity post-operatively and will follow MD recommendations  States when to call healthcare provider regarding medication questions or post-operative complications  Teaching Method Utilized:  Visual Auditory Hands on  Barriers to learning/adherence to lifestyle change: none  Demonstrated degree of understanding via:  Teach Back   Monitoring/Evaluation:  Dietary intake, exercise, and body weight. Follow up in 6 weeks for 2 month post-op visit.

## 2014-04-29 NOTE — Patient Instructions (Signed)
Patient to follow Phase 3A-Soft, High Protein Diet and [facility] for 2 months post-op nutrition visit for diet advancement.  Surgery date: 04/14/2014 Surgery type: Gastric sleeve [facility] lbs on 01/23/14  Weight today: 387.5 lbs  Weight change: 32 lbs  TANITA  BODY COMP RESULTS  03/03/14 3/816   BMI (kg/m^2) 53.9 49.8   Fat Mass (lbs) 259.5 230.0   Fat Free Mass (lbs) 160 157.5   Total Body Water (lbs) 117 115.5

## 2014-05-09 ENCOUNTER — Telehealth (HOSPITAL_COMMUNITY): Payer: Self-pay

## 2014-05-09 NOTE — Telephone Encounter (Signed)
Received e-mail from patient, see text below.  Patient's surgeon is out of the office and unreachable, I spoke with Dr. Jaclynn GuarneriBen Hoxworth, Bariatric Surgeon and Assistant during the original surgery.  He suggested that the patient walk during the flight a minimum of every hour and preferably every 30 minutes.  I passed this information along to the patient.     Juan ConesLaurie,  I have had a death in the family and [place].   Would it be safe to fly and/or drive at this point post surgery?  I was on blood thinners for 14 days post-op, but have been done with those for a week or so.   I have also reached out to Dr. Andrey CampanileWilson, but I thought it would be a good idea to ask your opinion, and think you will be more likely to get back to me in a timely manner.  I do own compression socks/stockings and will plan to wear them when traveling either way.  Thanks for your help,  Juan HelperJames Winters [phone]

## 2014-06-10 ENCOUNTER — Encounter: Payer: 59 | Attending: General Surgery | Admitting: Dietician

## 2014-06-10 VITALS — Ht 74.0 in | Wt 372.5 lb

## 2014-06-10 DIAGNOSIS — Z6841 Body Mass Index (BMI) 40.0 and over, adult: Secondary | ICD-10-CM | POA: Diagnosis not present

## 2014-06-10 DIAGNOSIS — Z713 Dietary counseling and surveillance: Secondary | ICD-10-CM | POA: Insufficient documentation

## 2014-06-10 NOTE — Patient Instructions (Addendum)
Goals:  Follow Phase 3B: High Protein + Non-Starchy Vegetables  Eat 3-6 small meals/snacks, every 3-5 hrs  Increase lean protein foods to meet 80g goal  Increase fluid intake to 64oz +  Avoid drinking 15 minutes before, during and 30 minutes after eating  Aim for >30 min of physical activity daily (consider going to gym)  Have protein shake each day for now until you are able to eat more protein  Consider adding protein snacks (see list)

## 2014-06-10 NOTE — Progress Notes (Signed)
  Follow-up visit:  8 Weeks Post-Operative Sleeve Gastrectomy Surgery  Medical Nutrition Therapy:  Appt start time: 1710 end time:  1730.  Primary concerns today: Post-operative Bariatric Surgery Nutrition Management. Returns with a 15 lb weight loss. Has been learning to not eat past the point of fullness. Can tolerate all foods. Feeling great overall.  Surgery date: 04/14/2014 Surgery type: Gastric sleeve [facility] on 01/23/14  Weight today: 372.5lbs  Weight change: 15 lbs, 40 lbs fat mass loss Total weight loss: 49.5 lbs  TANITA  BODY COMP RESULTS  03/03/14 04/29/14 06/10/14   BMI (kg/m^2) 53.9 49.8 47.8   Fat Mass (lbs) 259.5 230.0 190.0   Fat Free Mass (lbs) 160 157.5 182.5   Total Body Water (lbs) 117 115.5 133.5    Preferred Learning Style:   No preference indicated   Learning Readiness:   Ready  24-hr recall: B (AM): triple zero yogurt or 1 egg and 1.5 strips bacon (10-15 g) Snk (AM): none L (PM): 2-3 oz chicken salad with spinach (14-21 g) Snk (PM):none D (PM): 3 oz chicken breast and vegetable (21 g) Snk (PM): none  Fluid intake: 60 oz water, iced tea 2 days per week, Protein shakes (3-4 x week) 30 g protein Estimated total protein intake: 55-57 g before a protein shake (over 80 g without one)  Medications: see list  Supplementation: taking  Using straws: No Drinking while eating: Yes Hair loss: No Carbonated beverages: tried them N/V/D/C: No Dumping syndrome: No   Recent physical activity:  Walking 3-4 x week for 20 minutes  Progress Towards Goal(s):  In progress.  Handouts given during visit include:  Phase 3B High Protein and Non Starchy Vegetables  High Protein Snacks   Nutritional Diagnosis:  [place]-3.3 Overweight/obesity related to past poor dietary habits and physical inactivity as evidenced by patient w/ recent sleeve gastrectomy surgery following dietary guidelines for continued weight loss.    Intervention:  Nutrition  education/diet advancement. Goals:  Follow Phase 3B: High Protein + Non-Starchy Vegetables  Eat 3-6 small meals/snacks, every 3-5 hrs  Increase lean protein foods to meet 80g goal  Increase fluid intake to 64oz +  Avoid drinking 15 minutes before, during and 30 minutes after eating  Aim for >30 min of physical activity daily (consider going to gym)  Have protein shake each day for now until you are able to eat more protein  Consider adding protein snacks (see list)  Teaching Method Utilized:  Visual Auditory Hands on  Barriers to learning/adherence to lifestyle change: none  Demonstrated degree of understanding via:  Teach Back   Monitoring/Evaluation:  Dietary intake, exercise, and body weight. Follow up in 1 months for 3 month post-op visit.

## 2014-06-30 ENCOUNTER — Ambulatory Visit: Payer: 59 | Admitting: Dietician

## 2014-07-04 ENCOUNTER — Encounter: Payer: 59 | Attending: General Surgery | Admitting: Dietician

## 2014-07-04 ENCOUNTER — Encounter: Payer: Self-pay | Admitting: Dietician

## 2014-07-04 VITALS — Ht 74.0 in | Wt 367.5 lb

## 2014-07-04 DIAGNOSIS — Z713 Dietary counseling and surveillance: Secondary | ICD-10-CM | POA: Insufficient documentation

## 2014-07-04 DIAGNOSIS — Z6841 Body Mass Index (BMI) 40.0 and over, adult: Secondary | ICD-10-CM | POA: Insufficient documentation

## 2014-07-04 NOTE — Patient Instructions (Addendum)
Goals:  Follow Phase 3B: High Protein + Non-Starchy Vegetables  Eat 3-6 small meals/snacks, every 3-5 hrs  Increase lean protein foods to meet 80g goal  Increase fluid intake to 64oz +  Avoid drinking 15 minutes before, during and 30 minutes after eating  Aim for >30 min of physical activity daily   Plan to walk to the gym and do some weight lifting 4 x week  Have protein shake each day for now until you are able to eat more protein  Consider adding protein snacks (see list)  Avoid extra carbs for now (rice, wraps)  Surgery date: 04/14/2014 Surgery type: Gastric sleeve [facility] on 01[facility]23/14  Weight today: 367.5 lbs  Weight change: 5 lbs Total weight loss: 54.5 lbs  TANITA  BODY COMP RESULTS  03/03/14 04/29/14 06/10/14 07/04/14   BMI (kg/m^2) 53.9 49.8 47.8 47.2   Fat Mass (lbs) 259.5 230.0 190.0 191.5   Fat Free Mass (lbs) 160 157.5 182.5 176.0   Total Body Water (lbs) 117 115.5 133.5 129.0

## 2014-07-04 NOTE — Progress Notes (Signed)
  Follow-up visit:  12 Weeks Post-Operative Sleeve Gastrectomy Surgery  Medical Nutrition Therapy:  Appt start time: 905 end time:  925.  Primary concerns today: Post-operative Bariatric Surgery Nutrition Management. Returns with a 5 lb weight loss. Has started back to the gym this week. Things are going great. Still learning how much he can eat at one. Having some carbs such as rice or wraps. Never feels hungry.   Surgery date: 04/14/2014 Surgery type: Gastric sleeve [facility] on 01[facility]23/14  Weight today: 367.5 lbs  Weight change: 5 lbs Total weight loss: 54.5 lbs  TANITA  BODY COMP RESULTS  03/03/14 04/29/14 06/10/14 07/04/14   BMI (kg/m^2) 53.9 49.8 47.8 47.2   Fat Mass (lbs) 259.5 230.0 190.0 191.5   Fat Free Mass (lbs) 160 157.5 182.5 176.0   Total Body Water (lbs) 117 115.5 133.5 129.0    Preferred Learning Style:   No preference indicated   Learning Readiness:   Ready  24-hr recall: B (AM): triple zero yogurt or 1 egg and 1.5 strips bacon (10-15 g) Snk (AM): none L (PM): 3 oz chicken salad with spinach in a wrap (21 g) Snk (PM):none D (PM): 4 oz chicken breast and vegetable or sashimi with sushi and rice (28 g) Snk (PM): none  Fluid intake: 60+ oz water, unsweet iced tea 2 days per week, Protein shakes (4-5 x week) 30 g protein Estimated total protein intake: 59-94 g   Medications: see list  Supplementation: taking  Using straws: No Drinking while eating: Yes -tries not to  Hair loss: No Carbonated beverages: tried them N/V/D/C: No Dumping syndrome: No   Recent physical activity:  Walking 4 x week for 20-30 minutes  Progress Towards Goal(s):  In progress.    Nutritional Diagnosis:  [place]-3.3 Overweight/obesity related to past poor dietary habits and physical inactivity as evidenced by patient w/ recent sleeve gastrectomy surgery following dietary guidelines for continued weight loss.    Intervention:  Nutrition education/diet  advancement. Goals:  Follow Phase 3B: High Protein + Non-Starchy Vegetables  Eat 3-6 small meals/snacks, every 3-5 hrs  Increase lean protein foods to meet 80g goal  Increase fluid intake to 64oz +  Avoid drinking 15 minutes before, during and 30 minutes after eating  Aim for >30 min of physical activity daily   Plan to walk to the gym and do some weight lifting 4 x week  Have protein shake each day for now until you are able to eat more protein  Consider adding protein snacks (see list)  Avoid extra carbs for now (rice, wraps)  Teaching Method Utilized:  Visual Auditory Hands on  Barriers to learning/adherence to lifestyle change: none  Demonstrated degree of understanding via:  Teach Back   Monitoring/Evaluation:  Dietary intake, exercise, and body weight. Follow up in 6 weeks for 4.5 month post-op visit.

## 2014-07-10 ENCOUNTER — Ambulatory Visit: Payer: 59 | Admitting: Dietician

## 2014-08-18 ENCOUNTER — Ambulatory Visit: Payer: 59 | Admitting: Dietician

## 2014-09-04 ENCOUNTER — Encounter: Payer: Self-pay | Admitting: Dietician

## 2014-09-04 ENCOUNTER — Encounter: Payer: 59 | Attending: General Surgery | Admitting: Dietician

## 2014-09-04 VITALS — Ht 74.0 in | Wt 349.0 lb

## 2014-09-04 DIAGNOSIS — Z6841 Body Mass Index (BMI) 40.0 and over, adult: Secondary | ICD-10-CM | POA: Diagnosis not present

## 2014-09-04 DIAGNOSIS — Z713 Dietary counseling and surveillance: Secondary | ICD-10-CM | POA: Diagnosis not present

## 2014-09-04 NOTE — Progress Notes (Signed)
  Follow-up visit:  5 Post-Operative Sleeve Gastrectomy Surgery  Medical Nutrition Therapy:  Appt start time: 330 end time: 350 .  Primary concerns today: Post-operative Bariatric Surgery Nutrition Management. Returns with a 18.5 lb weight loss. Has started back to the gym 4-5 x week. Things are going great. Still not feeling hungry.   Has had dumping about about 2 x since surgery. Has had some pasta.   Surgery date: 04/14/2014 Surgery type: Gastric sleeve [facility] on 01/23/14  Weight today: 349.0 lbs  Weight change: 18.5 lbs, 44 lbs  Total weight loss: 73 lbs  TANITA  BODY COMP RESULTS  03/03/14 04/29/14 06/10/14 07/04/14 09/04/14   BMI (kg/m^2) 53.9 49.8 47.8 47.2 44.8   Fat Mass (lbs) 259.5 230.0 190.0 191.5 147.5   Fat Free Mass (lbs) 160 157.5 182.5 176.0 201.5   Total Body Water (lbs) 117 115.5 133.5 129.0 147.5   ** question validity of tanita scale  Preferred Learning Style:   No preference indicated   Learning Readiness:   Ready  24-hr recall: B (AM): triple zero yogurt or 1 egg and 1.5 strips bacon (10-15 g) Snk (AM): none L (PM): 4 oz chicken salad/protein with vegetable and fruit (28 g) Snk (PM):1/2 time will have handful of nuts (6 g) D (PM): 6 oz chicken breast/beef, seafood (42 g) Snk (PM): none  Fluid intake: 60-70+ oz water, unsweet iced tea 3-4 days per week, 1 7 oz Pepsi per week, Protein shakes (4-5 x week) 40 g protein Estimated total protein intake: 80-131 g   Medications: see list  Supplementation: taking  Using straws: No Drinking while eating: Yes - doing better than he used to  Hair loss: No Carbonated beverages: pepsi 1 x week N/V/D/C: No Dumping syndrome: 2 x since surgery, not sure why   Recent physical activity:  Going to the gym 4-5 x week for 60-90 minutes cardio and weights  Progress Towards Goal(s):  In progress.    Nutritional Diagnosis:  [place]-3.3 Overweight/obesity related to past poor dietary habits and  physical inactivity as evidenced by patient w/ recent sleeve gastrectomy surgery following dietary guidelines for continued weight loss.    Intervention:  Nutrition education/diet advancement. Goals:  Follow Phase 3B: High Protein + Non-Starchy Vegetables  Eat 3-6 small meals/snacks, every 3-5 hrs  Increase lean protein foods to meet 80g goal  Increase fluid intake to 64oz +  Avoid drinking 15 minutes before, during and 30 minutes after eating  Aim for >30 min of physical activity daily   Continue going the gym and do weight lifting 4-5 x week  Skip protein shake since you can eat enough protein :)   Cut out Pepsi    Teaching Method Utilized:  Visual Auditory Hands on  Barriers to learning/adherence to lifestyle change: none  Demonstrated degree of understanding via:  Teach Back   Monitoring/Evaluation:  Dietary intake, exercise, and body weight. Follow up in 3 Months for 8 month post-op visit.

## 2014-09-04 NOTE — Patient Instructions (Addendum)
Goals:  Follow Phase 3B: High Protein + Non-Starchy Vegetables  Eat 3-6 small meals/snacks, every 3-5 hrs  Increase lean protein foods to meet 80g goal  Increase fluid intake to 64oz +  Avoid drinking 15 minutes before, during and 30 minutes after eating  Aim for >30 min of physical activity daily   Continue going the gym and do weight lifting 4-5 x week  Skip protein shake since you can eat enough protein :)   Cut out Pepsi

## 2014-12-09 ENCOUNTER — Ambulatory Visit: Payer: 59 | Admitting: Dietician

## 2014-12-31 ENCOUNTER — Ambulatory Visit: Payer: 59 | Admitting: Dietician

## 2015-02-03 ENCOUNTER — Encounter: Payer: 59 | Attending: General Surgery | Admitting: Dietician

## 2015-02-03 ENCOUNTER — Encounter: Payer: Self-pay | Admitting: Dietician

## 2015-02-03 VITALS — Ht 74.0 in | Wt 320.0 lb

## 2015-02-03 DIAGNOSIS — Z713 Dietary counseling and surveillance: Secondary | ICD-10-CM | POA: Insufficient documentation

## 2015-02-03 DIAGNOSIS — Z9884 Bariatric surgery status: Secondary | ICD-10-CM | POA: Insufficient documentation

## 2015-02-03 DIAGNOSIS — Z6841 Body Mass Index (BMI) 40.0 and over, adult: Secondary | ICD-10-CM

## 2015-02-03 NOTE — Patient Instructions (Addendum)
Goals:  Follow Phase 3B: High Protein + Non-Starchy Vegetables  Eat 3-6 small meals/snacks, every 3-5 hrs  Increase lean protein foods to meet 80g goal - incorporate more protein at lunch  Increase fluid intake to 64oz +  Avoid drinking 15 minutes before, during and 30 minutes after eating  Aim for >30 min of physical activity daily   Get back to working out  Limit extra carbs (bread, soda, starch) if weight loss slows down   Surgery date: 04/14/2014 Surgery type: Gastric sleeve [facility] on 01/23/14  Weight today: 320 lbs  Weight change: 29 lbs, 37 lbs  Total weight loss: 102 lbs Weight loss goal: 220 lbs  TANITA  BODY COMP RESULTS  03/03/14 04/29/14 06/10/14 07/04/14 09/04/14 02/03/15   BMI (kg/m^2) 53.9 49.8 47.8 47.2 44.8 41.1   Fat Mass (lbs) 259.5 230.0 190.0 191.5 147.5 110.5   Fat Free Mass (lbs) 160 157.5 182.5 176.0 201.5 209.5   Total Body Water (lbs) 117 115.5 133.5 129.0 147.5 153.5

## 2015-02-03 NOTE — Progress Notes (Signed)
  Follow-up visit: 10 Post-Operative Sleeve Gastrectomy Surgery  Medical Nutrition Therapy:  Appt start time: 520 end time: 545  Primary concerns today: Post-operative Bariatric Surgery Nutrition Management. Returns with a 29 lb weight loss. Things are going great. Has not gone to the gym in about 3 months. Started a new job which is keeping him busy. Works for [employer].Feels like he has a lot more energy than before and able to be more mobility. Does not hurt like he used to.   Still not feeling hungry.   Surgery date: 04/14/2014 Surgery type: Gastric sleeve [facility] on 01[facility]23/14  Weight today: 320 lbs  Weight change: 29 lbs, 37 lbs  Total weight loss: 102 lbs Weight loss goal: 220 lbs  TANITA  BODY COMP RESULTS  03/03/14 04/29/14 06/10/14 07/04/14 09/04/14 02/03/15   BMI (kg/m^2) 53.9 49.8 47.8 47.2 44.8 41.1   Fat Mass (lbs) 259.5 230.0 190.0 191.5 147.5 110.5   Fat Free Mass (lbs) 160 157.5 182.5 176.0 201.5 209.5   Total Body Water (lbs) 117 115.5 133.5 129.0 147.5 153.5    Preferred Learning Style:   No preference indicated   Learning Readiness:   Ready  24-hr recall: B (AM): [employer] protein shake (23 g) Snk (AM): none L (PM): soup and salad or sandwich with 1 oz [place] and 1 slice of cheese  (5-14 g) Snk (PM):none  D (PM): 5 oz chicken breast/beef/pork seafood with vegetable and starch (35 g) Snk (PM): none  Fluid intake: 60-70+ oz water, unsweet iced tea 3-4 days per week, 1 7 oz Pepsi per week, protein shake Estimated total protein intake: 63-72 g   Medications: see list  Supplementation: taking  Using straws: No Drinking while eating: No, trying to wait 30 minutes to eat Hair loss: No Carbonated beverages: pepsi 1 x week N/V/D/C: No Dumping syndrome: No   Recent physical activity:  None recently  Progress Towards Goal(s):  In progress.    Nutritional Diagnosis:  [place]-3.3 Overweight/obesity related to past poor dietary habits and  physical inactivity as evidenced by patient w/ recent sleeve gastrectomy surgery following dietary guidelines for continued weight loss.    Intervention:  Nutrition education/diet reinforcement Goals:  Follow Phase 3B: High Protein + Non-Starchy Vegetables  Eat 3-6 small meals/snacks, every 3-5 hrs  Increase lean protein foods to meet 80g goal - incorporate more protein at lunch  Increase fluid intake to 64oz +  Avoid drinking 15 minutes before, during and 30 minutes after eating  Aim for >30 min of physical activity daily   Get back to working out  Limit extra carbs (bread, soda, starch) if weight loss slows down    Teaching Method Utilized:  Visual Auditory Hands on  Barriers to learning/adherence to lifestyle change: none  Demonstrated degree of understanding via:  Teach Back   Monitoring/Evaluation:  Dietary intake, exercise, and body weight. Follow up in 6 Months for 16 month post-op visit.

## 2015-08-04 ENCOUNTER — Ambulatory Visit: Payer: 59 | Admitting: Dietician

## 2015-09-25 IMAGING — RF DG UGI W/ GASTROGRAFIN
11 series · 11 of 11 positions shown · IV contrast (omnipaque)
Comparison: None.

CLINICAL DATA: One day postop from gastric sleeve bariatric surgery
for morbid obesity.

EXAM:
WATER SOLUBLE UPPER GI SERIES
TECHNIQUE: Single-column upper GI series was performed using water soluble
contrast.
CONTRAST:  50mL OMNIPAQUE IOHEXOL 300 MG/ML  SOLN

[Series 1: run · 1 of 1 slices shown (1 of 10)]
[im 1/1]
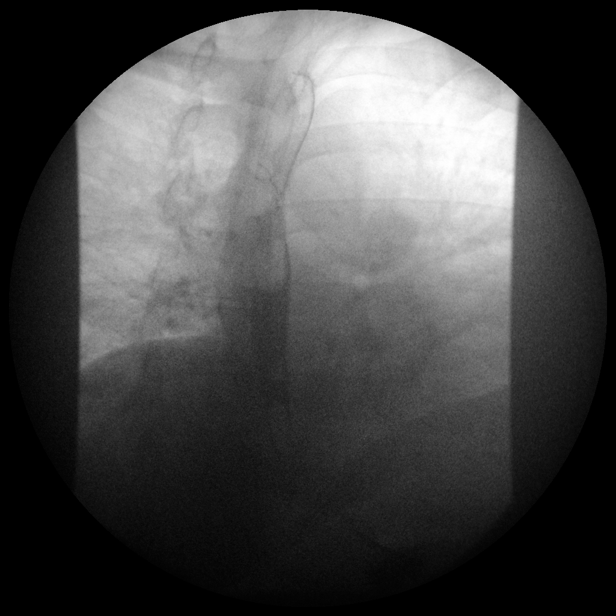

[Series 2: run · 1 of 1 slices shown (2 of 10)]
[im 1/1]
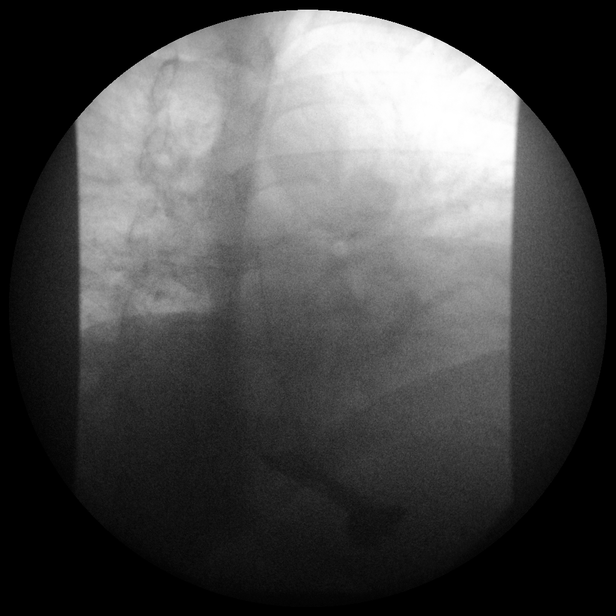

[Series 3: run · 1 of 1 slices shown (3 of 10)]
[im 1/1]
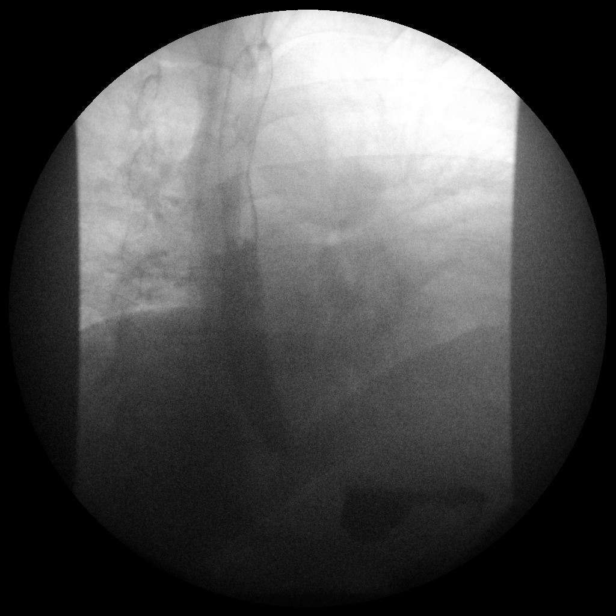

[Series 4: run · 1 of 1 slices shown (4 of 10)]
[im 1/1]
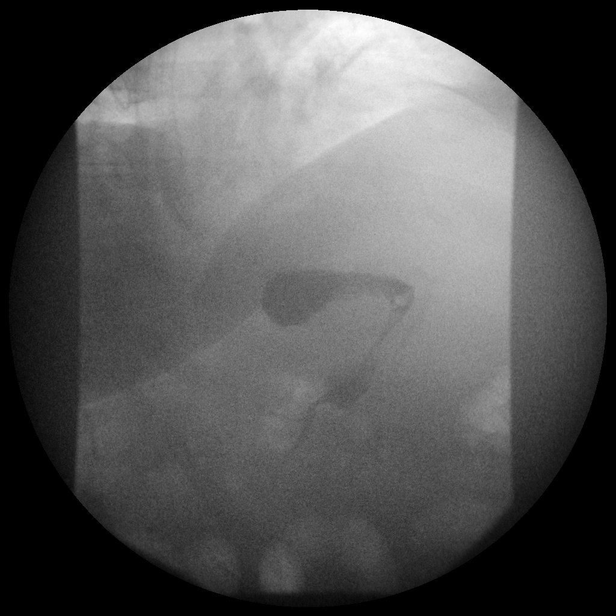

[Series 5: run · 1 of 1 slices shown (5 of 10)]
[im 1/1]
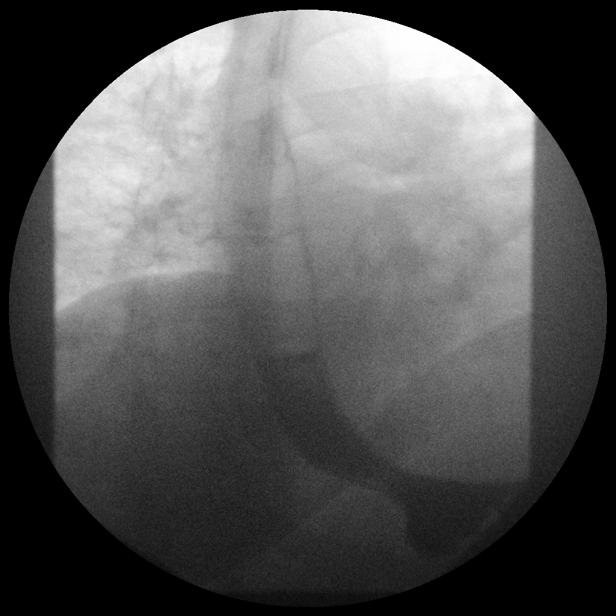

[Series 6: run · 1 of 1 slices shown (6 of 10)]
[im 1/1]
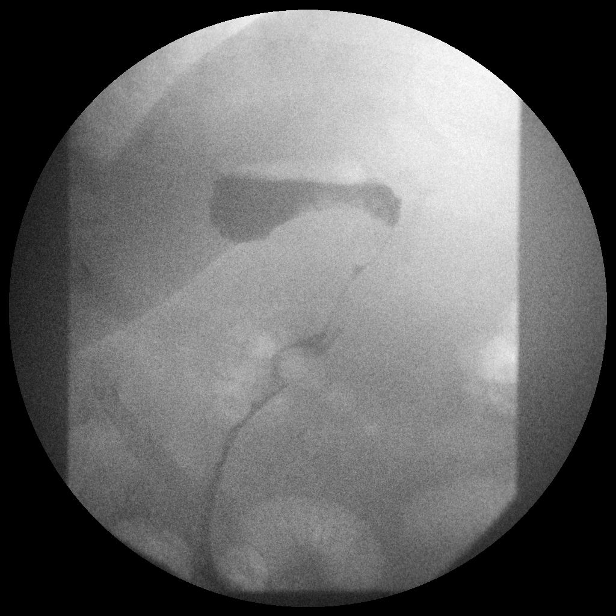

[Series 7: run · 1 of 1 slices shown (7 of 10)]
[im 1/1]
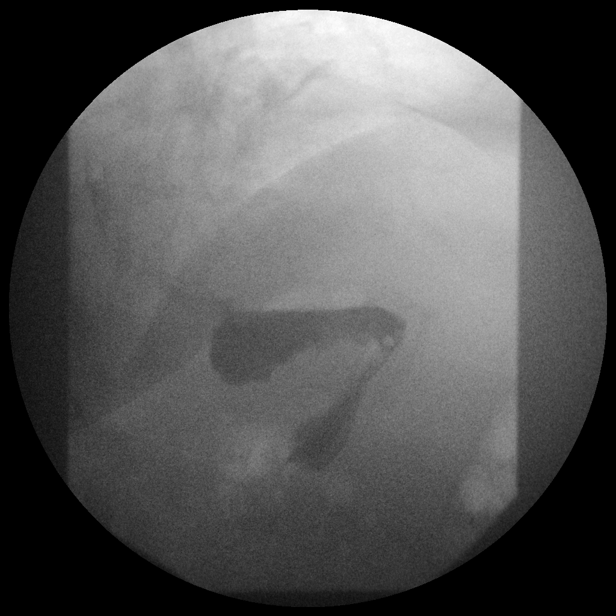

[Series 8: run · 1 of 1 slices shown (8 of 10)]
[im 1/1]
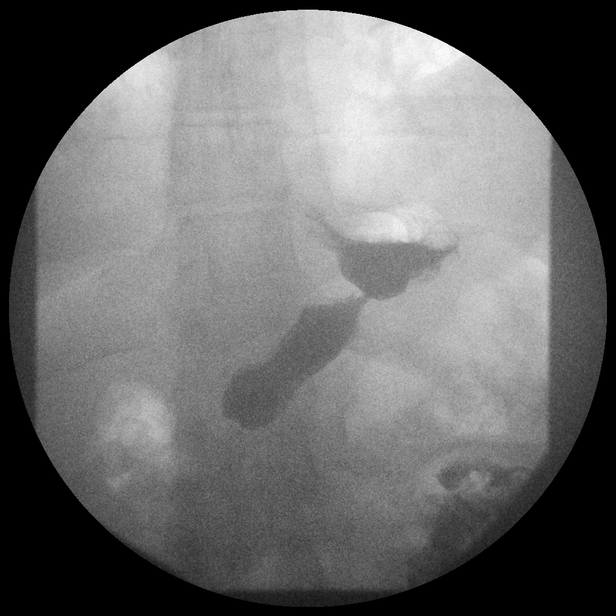

[Series 9: run · 1 of 1 slices shown (9 of 10)]
[im 1/1]
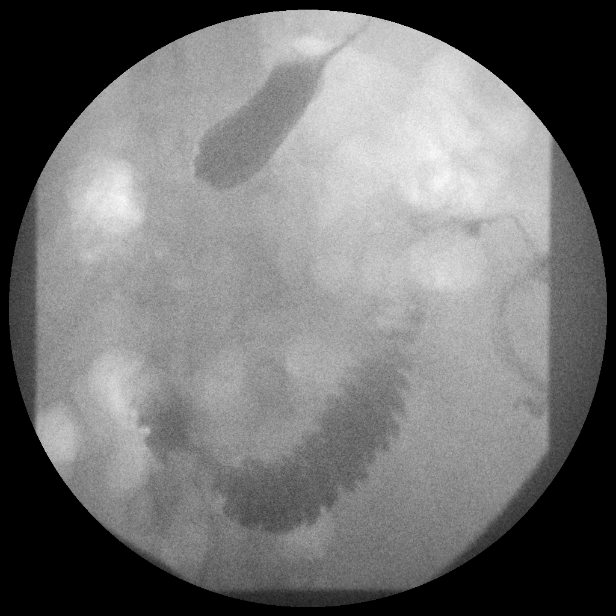

[Series 10: run · 1 of 1 slices shown (10 of 10)]
[im 1/1]
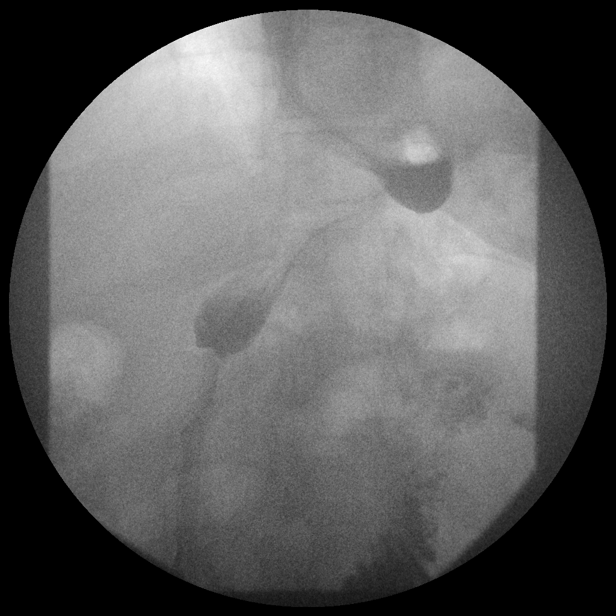

[Series 1001: view not recorded · 0.20mm/px · 1 of 1 slices shown]
[im 1/1]
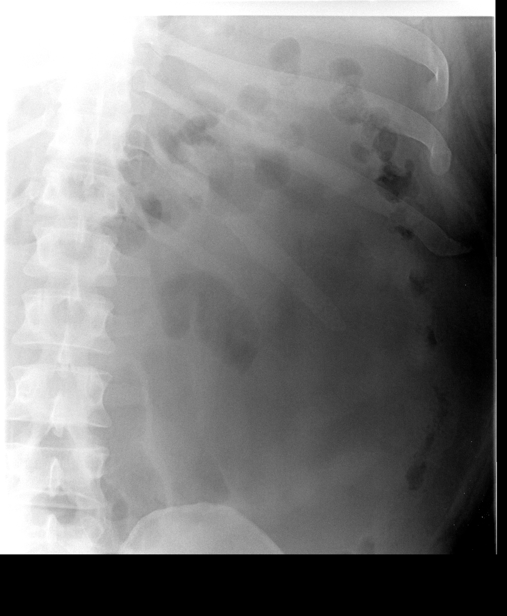

[11 of 11 positions shown; findings below may reference images not displayed]

FLUOROSCOPY TIME:  Fluoroscopy Time (in minutes and seconds): 1
minutes 48 seconds

Number of Acquired Images:  10
FINDINGS: The esophagus is normal in appearance. No evidence of esophageal
mass or stricture.

Expected postoperative appearance of gastric sleeve is seen. There
is no evidence of contrast leak or extravasation. There is no
evidence of obstruction. Prompt gastric emptying is seen. The
duodenum is also unremarkable in appearance.
IMPRESSION: Expected postoperative appearance of gastric sleeve. No evidence of
contrast leak, obstruction, or other complication.

## 2015-11-27 ENCOUNTER — Encounter (HOSPITAL_COMMUNITY): Payer: Self-pay

## 2015-12-08 MED FILL — AZITHROMYCIN 250 MG TABLET: 250 | 5 days supply | Qty: 6 | Fill #0

## 2016-11-14 ENCOUNTER — Encounter (HOSPITAL_COMMUNITY): Payer: Self-pay

## 2017-07-12 MED FILL — AMOXICILLIN 875 MG TABS: 875 | 10 days supply | Qty: 20 | Fill #0
